# Patient Record
Sex: Male | Born: 1960 | Race: Black or African American | Hispanic: No | Marital: Married | Smoking: Never smoker
Health system: Southern US, Community
[De-identification: ages and names within clinical notes are randomized; demographics above are authoritative.]

## PROBLEM LIST (undated history)

## (undated) DIAGNOSIS — I10 Essential (primary) hypertension: Secondary | ICD-10-CM

## (undated) DIAGNOSIS — G4730 Sleep apnea, unspecified: Secondary | ICD-10-CM

## (undated) DIAGNOSIS — K219 Gastro-esophageal reflux disease without esophagitis: Secondary | ICD-10-CM

## (undated) DIAGNOSIS — Z87442 Personal history of urinary calculi: Secondary | ICD-10-CM

## (undated) DIAGNOSIS — M1990 Unspecified osteoarthritis, unspecified site: Secondary | ICD-10-CM

## (undated) DIAGNOSIS — E785 Hyperlipidemia, unspecified: Secondary | ICD-10-CM

## (undated) DIAGNOSIS — F32A Depression, unspecified: Secondary | ICD-10-CM

## (undated) DIAGNOSIS — F329 Major depressive disorder, single episode, unspecified: Secondary | ICD-10-CM

## (undated) DIAGNOSIS — E039 Hypothyroidism, unspecified: Secondary | ICD-10-CM

## (undated) DIAGNOSIS — M4800 Spinal stenosis, site unspecified: Secondary | ICD-10-CM

## (undated) HISTORY — PX: THYROID SURGERY: SHX805

---

## 1997-01-01 HISTORY — PX: KNEE ARTHROSCOPY: SUR90

## 2007-07-08 ENCOUNTER — Ambulatory Visit: Payer: Self-pay | Admitting: Internal Medicine

## 2008-03-16 ENCOUNTER — Ambulatory Visit: Payer: Self-pay | Admitting: Internal Medicine

## 2010-11-29 ENCOUNTER — Ambulatory Visit: Payer: Self-pay

## 2010-12-08 ENCOUNTER — Ambulatory Visit: Payer: Self-pay

## 2011-05-03 ENCOUNTER — Inpatient Hospital Stay: Payer: Self-pay | Admitting: Psychiatry

## 2011-05-03 LAB — CBC
HCT: 44.8 % (ref 40.0–52.0)
MCH: 31.1 pg (ref 26.0–34.0)
MCHC: 34.3 g/dL (ref 32.0–36.0)
Platelet: 333 10*3/uL (ref 150–440)
RDW: 13.4 % (ref 11.5–14.5)
WBC: 6.2 10*3/uL (ref 3.8–10.6)

## 2011-05-03 LAB — DRUG SCREEN, URINE
Amphetamines, Ur Screen: NEGATIVE (ref ?–1000)
Benzodiazepine, Ur Scrn: NEGATIVE (ref ?–200)
Cannabinoid 50 Ng, Ur ~~LOC~~: NEGATIVE (ref ?–50)
Cocaine Metabolite,Ur ~~LOC~~: NEGATIVE (ref ?–300)
Phencyclidine (PCP) Ur S: NEGATIVE (ref ?–25)
Tricyclic, Ur Screen: NEGATIVE (ref ?–1000)

## 2011-05-03 LAB — COMPREHENSIVE METABOLIC PANEL
Alkaline Phosphatase: 62 U/L (ref 50–136)
BUN: 12 mg/dL (ref 7–18)
Calcium, Total: 9 mg/dL (ref 8.5–10.1)
Chloride: 105 mmol/L (ref 98–107)
Co2: 31 mmol/L (ref 21–32)
EGFR (African American): >60
EGFR (Non-African Amer.): >60
Glucose: 99 mg/dL (ref 65–99)
Osmolality: 281 (ref 275–301)
Potassium: 3.8 mmol/L (ref 3.5–5.1)
SGOT(AST): 20 U/L (ref 15–37)
SGPT (ALT): 30 U/L
Sodium: 141 mmol/L (ref 136–145)
Total Protein: 7.6 g/dL (ref 6.4–8.2)

## 2011-05-03 LAB — ACETAMINOPHEN LEVEL: Acetaminophen: <2 ug/mL

## 2011-05-03 LAB — ETHANOL: Ethanol: <3 mg/dL

## 2011-11-09 ENCOUNTER — Ambulatory Visit: Payer: Self-pay | Admitting: Otolaryngology

## 2012-06-12 ENCOUNTER — Ambulatory Visit: Payer: Self-pay

## 2012-11-01 HISTORY — PX: THYROIDECTOMY: SHX17

## 2013-05-13 DIAGNOSIS — E039 Hypothyroidism, unspecified: Secondary | ICD-10-CM | POA: Insufficient documentation

## 2014-02-12 DIAGNOSIS — K219 Gastro-esophageal reflux disease without esophagitis: Secondary | ICD-10-CM | POA: Insufficient documentation

## 2014-02-12 DIAGNOSIS — I10 Essential (primary) hypertension: Secondary | ICD-10-CM | POA: Insufficient documentation

## 2014-02-12 DIAGNOSIS — E782 Mixed hyperlipidemia: Secondary | ICD-10-CM | POA: Insufficient documentation

## 2014-02-25 ENCOUNTER — Inpatient Hospital Stay: Payer: Self-pay | Admitting: Psychiatry

## 2014-04-22 DIAGNOSIS — M545 Low back pain, unspecified: Secondary | ICD-10-CM | POA: Insufficient documentation

## 2014-04-22 DIAGNOSIS — M1712 Unilateral primary osteoarthritis, left knee: Secondary | ICD-10-CM | POA: Insufficient documentation

## 2014-04-25 NOTE — Discharge Summary (Signed)
PATIENT NAME:  Louis Thomas, Louis Thomas MR#:  [id number] DATE OF BIRTH:  [birth year]  DATE OF ADMISSION:  05/03/2011 DATE OF DISCHARGE:  05/07/2011  HOSPITAL COURSE: See dictated history and physical for details of admission. This 54 year old man was admitted to the hospital with complaints of severe major depression that had been going on for quite a while. Things had been getting worse. He was having suicidal thoughts. It was getting harder for him to function. He was treated in the hospital with a combination of supportive and educational therapy, both individual and group, as well as initiating Prozac 20 mg per day. During his hospital stay the patient showed gradual improvement in his acute depression. Mood became a little bit brighter. Hopelessness decreased. Suicidal ideation went away. He was able to articulate more positive thoughts about himself and his life and more hopefulness about things in the future. He was able to articulate good reasons for not killing himself including the importance of his family. He showed more optimism. The patient agreed to follow-up with psychiatrist in the community.   DISCHARGE MEDICATIONS:  1. Synthroid 0.1 mg q.6 a.m.  2. Lisinopril/hydrochlorothiazide 10/12.5 one tablet per day.  3. Prozac 20 mg per day.  4. Ambien 10 mg at night p.r.n. for sleep.   LABORATORY RESULTS: Admission labs showed a drug screen that was negative. TSH was 0.48. Alcohol undetectable. Chemistries unremarkable. CBC normal. Acetaminophen and salicylates unremarkable.   DISCHARGE MENTAL STATUS EXAMINATION: Neatly dressed and groomed. Improved eye contact. Speech quiet but normal tone and rate. Affect still blunted but less so than before and no longer tearful. Mood stated as better. Thoughts lucid and directed. No evidence of loosening of associations or delusional thinking. Denies suicidal or homicidal ideation. Denies hallucinations. Improved insight and judgment.   DISPOSITION: Discharge back  home. Follow-up with psychiatrist in the community.   DIAGNOSES PRINCIPLE AND PRIMARY:  AXIS I: Major depressive episode, severe, single episode.   SECONDARY DIAGNOSES:  AXIS I: No further.   AXIS II: No diagnosis.   AXIS III:  1. Hypertension. 2. Hypothyroidism.   AXIS IV: Moderate stress.   AXIS V: Functioning at time of discharge 54.  ____________________________ Gonzella Lex, MD jtc:drc D: 05/10/2011 17:17:12 ET T: 05/11/2011 10:49:36 ET JOB#: [id number]  cc: Gonzella Lex, MD, <Dictator> Gonzella Lex MD ELECTRONICALLY SIGNED 05/11/2011 11:39

## 2014-04-25 NOTE — H&P (Signed)
PATIENT NAME:  Louis Thomas, Louis Thomas MR#:  [id number] DATE OF BIRTH:  [birth year]  DATE OF ADMISSION:  05/03/2011  IDENTIFYING INFORMATION AND CHIEF COMPLAINT: 54 year old man presented voluntarily to the Emergency Room.   CHIEF COMPLAINT: "My wife told me I should come here."   HISTORY OF PRESENT ILLNESS: Patient reports symptoms of severe major depression that have been present for years and have been getting worse. Mood stays down and depressed and negative most of the time. Almost constant negative thinking about himself. Feels worthless, hopeless, and like he would be better off dead. Has had some thoughts about killing himself, but without any plan that he will admit to. He has become more withdrawn socially and does not feel comfortable leaving the house. Has had what sounds like possibly some paranoid ideation, feeling like people are watching him and talking about him. He has felt more stressed at work. Today he was unable to "cope" and did not go in to work. He is not clear that there has been any particular specific event that happened that triggered this but apparently his wife has finally prevailed upon him to come to the Emergency Room. Patient has not been on any kind of treatment for depression for some years. He does not drink or abuse drugs. Major stresses are not entirely clear. He says that his work is stressful but it does not sound like it is any different than it has been for 20 some years. He does not describe any specific problem at home. Health seems to be reasonably good for someone his age.   PAST PSYCHIATRIC HISTORY: No previous psychiatric hospitalizations. Denies history of suicide attempts. He used to see a therapist and psychiatrist in the past. Says this was several years ago. He remembers that the therapist that he saw was Derrek Gu but cannot remember a doctor that he saw. He cannot remember what medications he took but says that he took several different antidepressants. He  says that he thought that they all "cause more trouble than they helped". It is not clear that he was on anything for a therapeutic trial and he cannot remember the names of any medicines.   SUBSTANCE ABUSE HISTORY: Says that occasionally he will drink a small glass of wine at night to try and help himself sleep but has cut back on that recently. No history of alcohol abuse. No history of drug abuse.   PAST MEDICAL HISTORY: Says that he has high blood pressure but cannot remember the name of the medicine that he takes. Also has hypothyroidism, had resection of the thyroid lobes several years ago. He has been on Synthroid ever since. He cannot remember the dose. His primary care doctor is Dr. Freda Munro.   FAMILY HISTORY: One of his brothers committed suicide last year. The brother was suffering from Parkinson's disease. No other clear family history of mental illness.   SOCIAL HISTORY: Patient lives with his wife. He has two grown children. 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 He says that it is stressful but it is not clear that there has been any cataclysmic stress with it.   REVIEW OF SYSTEMS: Mood is depressed, feels negative. Has negative thoughts about himself all the time. Feels anxious a lot of the time. Feels like people are talking about him. Stays up late at night because he feels like his mind is racing. Appetite is normal. Feels fatigued much of the time during  the day. Has had some passive suicidal thoughts. No admitted acute plan. Denies hallucinations.   MENTAL STATUS EXAMINATION: This is a patient interviewed in the Emergency Room wearing hospital clothes. Eye contact was remarkably poor. He did not make any eye contact with me in the 25 minutes or more that we were talking. His psychomotor activity was limited. Speech was quiet but easy to understand. Affect was blunted, flat and became tearful later in the interview. Mood was stated as  depressed. Thoughts are slow but lucid. No obvious loosening of associations or delusions, although it is hard to tell with the paranoia. Denies hallucinations. Has had some recent suicidal thoughts. Denies immediate intent but is not very convincing because he keeps talking about how worthless he feels. Denies homicidal ideation. Insight and judgment somewhat impaired.   PHYSICAL EXAMINATION:  VITAL SIGNS: Pulse 79, temperature 98.3, respirations 18, blood pressure 113/69.   HEENT: Pupils are equal and reactive. Face is symmetric. Mucosa normal. He wears glasses.   MUSCULOSKELETAL: Full range of motion at all extremities. Normal gait.   LUNGS: Clear with no extra sounds.   HEART: Regular rate and rhythm.   ABDOMEN: Soft, nontender, normal bowel sounds.   NEUROLOGICAL: Strength and reflexes symmetric and normal throughout.   SKIN: No skin lesions identified.   CURRENT MEDICATIONS:  1. Synthroid, unknown dose. 2. Unknown blood pressure medicine, unknown dose.   ALLERGIES: Sulfa.   ASSESSMENT: This is a 54 year old man who is presenting with multiple symptoms of severe major depression including suicidal ideation. He downplays it a little bit but it sounds like it has really been pretty pronounced recently. Significant worries here about the patient having a risk of suicide. Not currently getting any kind of treatment for his depression. Certainly does seem to need hospitalization for stability and safety.   TREATMENT PLAN: Admit patient to the hospital. I propose to him that since he cannot remember any of the medicines he has been on before we will simply start with fluoxetine 20 mg a day. He did not argue about that. Continue Synthroid. At the moment I am going to arbitrarily pick a medium size dose of 100 mcg a day. We will follow up with that and see what we can find out. Since his blood pressure is not elevated right now I will defer starting any blood pressure medicine. Engage him in  groups and activities on the unit, get collateral information, review lab studies.   DIAGNOSIS PRINCIPLE AND PRIMARY:  AXIS I: Major depressive episode, severe, recurrent.   SECONDARY DIAGNOSES:  AXIS I: No further.   AXIS II: No diagnosis.   AXIS III:  1. Hypertension.  2. Hypothyroidism.   AXIS IV: Mild to moderate-Seems to have supportive family situation.   AXIS V: Functioning at time of admission 24.   ____________________________ Gonzella Lex, MD jtc:cms D: 05/03/2011 19:32:07 ET T: 05/04/2011 06:10:37 ET JOB#: [id number]  cc: Gonzella Lex, MD, <Dictator> Gonzella Lex MD ELECTRONICALLY SIGNED 05/04/2011 8:54

## 2014-05-02 NOTE — H&P (Signed)
PATIENT NAME:  Louis Thomas, Louis Thomas MR#:  [id number] DATE OF BIRTH:  08-14-1960  DATE OF ADMISSION:  02/25/2014  REFERRING PHYSICIAN: Emergency Room MD   ATTENDING PHYSICIAN: Jolanta B. Pucilowska, MD   IDENTIFYING DATA: Mr. Louis Thomas is a 53 year old male with history of anxiety and depression.   CHIEF COMPLAINT: "I'm mad."  HISTORY OF PRESENT ILLNESS: Mr. Louis Thomas has a history of depression. He was hospitalized at [facility] 2 years ago in a similar scenario. He initially followed up with psychiatrist and a therapist, but after he got better he stopped those treatments. He reports that for the past several months it has been increasingly difficult for him to do his work. 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 They fired several people now required a smaller number of machinists, increases productivity, and the patient finds it is very difficult; he is a Secondary school teacher. He feels that management does not allow him adequate time to do his work well. In addition, he is asked to do additional tasks while working on his machine. This causes disruption in his workflow and imperfection in the final product. He refuses to do inadequate products. There is also conflict with his supervisor, who  the patient fears is pestering him, following him, making constant derogatory comments. He has been increasingly depressed and anxious. On the day of admission, when his supervisor was, again, following him around making comments, the patient and asked him to leave him alone, as he was unable to get away from pestering. This was considered inappropriate behavior. The patient became increasingly angry. He fears that he could hurt himself or other people at work. He came to the hospital asking for help. He endorses many symptoms of depression with poor sleep, decreased appetite, anhedonia, feeling hopelessness, worthlessness, poor energy and concentration, social  isolation, crying spells, heightened anxiety that now culminated in suicidal thoughts. He denies psychotic symptoms. Denies symptoms suggestive of bipolar mania. He denies alcohol or illicit substance use.   PAST PSYCHIATRIC HISTORY: As above. One prior hospitalization 2 years ago. He was prescribed Prozac at bedtime for depression and Ambien for sleep, which he tolerated well. There were no suicide attempts. No history of substance use.   FAMILY PSYCHIATRIC HISTORY: He is brother committed suicide 3 years ago. He had severe Parkinson's; the patient does not know any details of his mental illness.   PAST MEDICAL HISTORY: Hypertension, constipation, hypothyroidism.   ALLERGIES: SULFA DRUGS.   MEDICATIONS ON ADMISSION: Amlodipine 5 mg daily, Lexapro 10 mg daily, hydrochlorothiazide 25 mg daily, Synthroid 0.125 mg daily, Colace 100 mg twice daily,   SOCIAL HISTORY: He is married. He lives with his wife, who is very supportive. 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 He used to be very proud of his work and of his team. He feels that everything has been ruined with the new management and is unable to continue working. His wife discourages him from going back to work, as she knows how depressed and sad the patient has become.   REVIEW OF SYSTEMS:  CONSTITUTIONAL: No fevers or chills. No weight changes.  EYES: No double or blurred vision.  EAR, NOSE, AND THROAT: No hearing loss.  RESPIRATORY: No shortness of breath or cough.  CARDIOVASCULAR: No chest pain or orthopnea.  GASTROINTESTINAL: No abdominal pain, nausea, vomiting, or diarrhea.  GENITOURINARY: No incontinence or frequency.  ENDOCRINE: No heat or cold  intolerance.  LYMPHATIC: No anemia or easy bruising.  INTEGUMENTARY: No acne or rash.  MUSCULOSKELETAL: No muscle or joint pain.  NEUROLOGIC: No tingling or weakness.  PSYCHIATRIC: See history of present illness for details.   PHYSICAL EXAMINATION:   VITAL SIGNS: Blood pressure 156/108, pulse 74, respirations 18, temperature 98.8.  GENERAL: This is a well-developed, middle-aged male in no acute distress.  HEENT: The pupils are equal, round, and reactive to light.  NECK: Supple. No thyromegaly.  LUNGS: Clear to auscultation.  HEART: Regular rhythm and rate. No rubs, murmurs, or gallops.  ABDOMEN: Soft, nondistended. Positive bowel sounds.  MUSCULOSKELETAL: Normal muscle strength in all extremities.  SKIN: No rashes or bruises.  LYMPHATIC: No cervical adenopathy.  NEUROLOGIC: Cranial nerves II-XII are intact.   LABORATORY DATA: Chemistries are within normal limits except for blood glucose of 134 and sodium of 135. Blood alcohol level is 0. LFTs are within normal limits. Urine toxicology screen is negative for substances. TSH 16.7, free thyroxine 0.95. CBC within normal limits. Serum acetaminophen and salicylates are low.   MENTAL STATUS EXAMINATION ON ADMISSION: The patient is alert and oriented to person, place, time, and situation. He is pleasant, polite, and cooperative. There is severe psychomotor retardation. He maintains limited eye contact. He is well-groomed and casually dressed. His speech is very soft. Mood is depressed with flat affect. Thought process is logical and goal oriented. Thought content: He is no longer suicidal or homicidal, but was admitted after voicing thoughts of hurting himself and his coworkers at work. He is not delusional or paranoid. He denies auditory or visual hallucinations. His cognition is grossly intact. Registration, recall, short and Berlanga-term memory are intact. He is of average intelligence and fund of knowledge. His insight and judgment are fair.   SUICIDE RISK ASSESSMENT ON ADMISSION: This is a patient with a history of depression and anxiety who returns to the hospital because there is depressed and suicidal in the context of stress at work.    INITIAL DIAGNOSES:  AXIS I: Major depressive disorder,  recurrent, severe.  AXIS II: Deferred.  AXIS III: Hypertension, hypothyroidism, constipation.   PLAN: The patient was admitted to [facility] behavioral medicine unit for safety, stabilization, and medication management.  1.  Suicidal ideation: The patient is able to contract for safety.  2.  Mood: The patient complained of abdominal pain, nausea, and diarrhea from Lexapro and is asking to switch back to Prozac. We will prescribe 20 mg of Prozac.  3.  Insomnia: He will be offered Ambien; that was helpful in the past.  4.  Hypertension: We will continue lisinopril and hydrochlorothiazide.  5.  Hypothyroidism: His TSH is elevated with normal T4. We will continue Synthroid.   DISPOSITION: He will be discharged to home with his wife. He will follow up with a new psychiatrist in the community, probably CBC, given his insurance.     ____________________________ Wardell Honour. Bary Leriche, MD jbp:bm D: 02/25/2014 22:28:18 ET T: 02/25/2014 22:52:28 ET JOB#: [id number]  cc: Jolanta B. Bary Leriche, MD, <Dictator> Clovis Fredrickson MD ELECTRONICALLY SIGNED 03/15/2014 7:23

## 2014-05-02 NOTE — Consult Note (Signed)
PATIENT NAME:  Louis Thomas, Louis Thomas MR#:  [id number] DATE OF BIRTH:  07-Sep-1960  DATE OF CONSULTATION:  02/24/2014  CONSULTING PHYSICIAN:  Gonzella Lex, MD  IDENTIFYING INFORMATION AND REASON FOR CONSULTATION: A 54 year old man with a past history of depression who presented to the Emergency Room stating that he just felt overwhelmed.   HISTORY OF PRESENT ILLNESS: Information obtained from the patient and the chart. The patient reports that for the past several weeks, he has been feeling more and more overwhelmed at work. He talks about how he feels like the people at work are harassing him by demanding too much from him. He says he feels like he has a target on his back at work. It sounds like he does have a lot of demands placed on him but it sounds like he is taking it in the worst way and is getting paranoid. He feels tired all the time. He feels hopeless. He has had some suicidal thoughts and admits that he has even had thoughts about losing his temper and going off and assaulting or killing people at work although does not have any plan to do anything like that. Not having any psychotic symptoms. He is not currently getting any outpatient psychiatric treatment and denies abuse of alcohol or drugs. He has not been going for a mental health treatment for a couple of years. Job is a major stress to him. He had tried to go see his doctor a week ago and wanted to get a referral to a psychiatric provider, but says he has not gotten a referral yet instead his primary care doctor called in what he called some kind of medicine which appears to be Lexapro. He says he only took it a couple of times, but he thought it made him feel tired.   PAST PSYCHIATRIC HISTORY: He was admitted here in 2013 and at that time he was also suffering severe depression. Similar kind of symptoms of anxiety and paranoia and frustration along with it, but did not appear to be fully psychotic. Prior to that he had had treatment for  depression in the past as well. He denies hospitalizations other than the one here in 2013, denies any serious suicide attempts or history of violence. He thinks that the Prozac that he took helped him.   SOCIAL HISTORY:  He lives with his wife. He works as a Glass blower/designer. He finds it very stressful.  He says he has been there 32 years and their demanding too much from him.   FAMILY HISTORY: None known   PAST MEDICAL HISTORY: High blood pressure and hypothyroidism.   SUBSTANCE ABUSE: He says he does not drink much. He has not been drinking a significant amount recently and denies that he abuses any other drugs.   CURRENT MEDICATIONS: Hydrochlorothiazide 25 mg a day, levothyroxine 100 mcg a day, amlodipine 5 mg a day.   ALLERGIES: Sulfa.   REVIEW OF SYSTEMS:  Depressed mood. Fatigue. Feeling overwhelmed. Denies auditory or visual hallucinations. Passive suicidal and homicidal ideation, but felt overwhelmed by them yesterday.   MENTAL STATUS EXAMINATION:  Neatly groomed gentleman who looks his stated age, cooperative with the interview. Eye contact intermittent, psychomotor activity, fidgety. Speech is decreased in tone, sometimes are rushed. Thoughts are scattered but not psychotic at all. No evidence of delusional thinking, although he has a paranoid cast to his thinking. He denies auditory or visual hallucinations.  He denies acute suicidal or homicidal intent, but does seem to feel  at times, overwhelmed and out of control. He can repeat three words immediately, only remembers one of them at three minutes. He is alert and oriented x4. Judgment and insight slightly questionable, intelligence normal.    PHYSICAL EXAMINATION:   VITAL SIGNS: Blood pressure is 154/108, respirations 20, pulse 114, and temperature 99.   LABORATORY RESULTS: Drug screen is all negative. Free thyroxine normal at 0.95. TSH elevated at 75.8 and salicylates negative. CBC normal. Alcohol negative. Chemistry panel: Low  potassium 3.2, low sodium 135.   ASSESSMENT: A 54 year old man with recurrent episodes of major depression, not getting outpatient treatment, once again feeling overwhelmed, having some paranoid ideas that suggest the possibility of exploding and hurting himself or someone else without treatment and feeling rather helpless. I think he needs hospitalization for stabilization.   TREATMENT PLAN: Admit to psychiatry. Suicide precautions in place. Continue his outpatient medicine, including the Lexapro that was recently started. The patient will be engaged in groups and activities and the primary treatment team downstairs can work on further plans.   DIAGNOSIS, PRINCIPAL AND PRIMARY:  AXIS I: Major depression, severe, recurrent.   SECONDARY DIAGNOSES:  AXIS I: No further.  AXIS II: No diagnosis.  AXIS III: High blood pressure, hypothyroidism.    ____________________________ Gonzella Lex, MD jtc:at D: 02/24/2014 18:23:13 ET T: 02/24/2014 18:48:48 ET JOB#: [id number]  cc: Gonzella Lex, MD, <Dictator> Gonzella Lex MD ELECTRONICALLY SIGNED 03/09/2014 10:36

## 2014-06-20 ENCOUNTER — Encounter (HOSPITAL_COMMUNITY): Payer: Self-pay | Admitting: Emergency Medicine

## 2014-06-20 ENCOUNTER — Emergency Department (HOSPITAL_COMMUNITY)
Admission: EM | Admit: 2014-06-20 | Discharge: 2014-06-20 | Disposition: A | Discharge status: Home / Self Care | Payer: 59 | Attending: Emergency Medicine | Admitting: Emergency Medicine

## 2014-06-20 DIAGNOSIS — E669 Obesity, unspecified: Secondary | ICD-10-CM | POA: Diagnosis not present

## 2014-06-20 DIAGNOSIS — R42 Dizziness and giddiness: Secondary | ICD-10-CM | POA: Insufficient documentation

## 2014-06-20 DIAGNOSIS — Z79899 Other long term (current) drug therapy: Secondary | ICD-10-CM | POA: Diagnosis not present

## 2014-06-20 DIAGNOSIS — E039 Hypothyroidism, unspecified: Secondary | ICD-10-CM | POA: Insufficient documentation

## 2014-06-20 DIAGNOSIS — I10 Essential (primary) hypertension: Secondary | ICD-10-CM | POA: Diagnosis not present

## 2014-06-20 DIAGNOSIS — R55 Syncope and collapse: Secondary | ICD-10-CM | POA: Insufficient documentation

## 2014-06-20 HISTORY — DX: Essential (primary) hypertension: I10

## 2014-06-20 HISTORY — DX: Hypothyroidism, unspecified: E03.9

## 2014-06-20 LAB — I-STAT CHEM 8, ED
BUN: 17 mg/dL (ref 6–20)
CALCIUM ION: 1.15 mmol/L (ref 1.12–1.23)
Chloride: 98 mmol/L — ABNORMAL LOW (ref 101–111)
Creatinine, Ser: 1.4 mg/dL — ABNORMAL HIGH (ref 0.61–1.24)
GLUCOSE: 149 mg/dL — AB (ref 65–99)
HCT: 45 % (ref 39.0–52.0)
HEMOGLOBIN: 15.3 g/dL (ref 13.0–17.0)
POTASSIUM: 3.5 mmol/L (ref 3.5–5.1)
Sodium: 141 mmol/L (ref 135–145)
TCO2: 27 mmol/L (ref 0–100)

## 2014-06-20 LAB — CBC
HEMATOCRIT: 43.4 % (ref 39.0–52.0)
Hemoglobin: 14.4 g/dL (ref 13.0–17.0)
MCH: 30.6 pg (ref 26.0–34.0)
MCHC: 33.2 g/dL (ref 30.0–36.0)
MCV: 92.1 fL (ref 78.0–100.0)
PLATELETS: 280 10*3/uL (ref 150–400)
RBC: 4.71 MIL/uL (ref 4.22–5.81)
RDW: 13.5 % (ref 11.5–15.5)
WBC: 7.5 10*3/uL (ref 4.0–10.5)

## 2014-06-20 LAB — CBG MONITORING, ED: GLUCOSE-CAPILLARY: 136 mg/dL — AB (ref 65–99)

## 2014-06-20 NOTE — ED Notes (Signed)
[employer]ge [employer] manager, [employer] in to speak with pt that is upset over wait to see MD.  States he wants to leave.  Pt raising voice and states he doesn't want to tell me about why he is here.  EDP in to eval pt while RN speaking with pt.  Pt initially becoming verbally aggressive with EDP and states he doesn't want to tell anyone else what happened.  Family members at bedside explaining to Dr. Alvino Chapel what happened with pt today.

## 2014-06-20 NOTE — ED Notes (Signed)
Pt ambulated on the hallway with steady gait, no dizziness or SOB noticed.

## 2014-06-20 NOTE — Discharge Instructions (Signed)
Near-Syncope Near-syncope (commonly known as near fainting) is sudden weakness, dizziness, or feeling like you might pass out. During an episode of near-syncope, you may also develop pale skin, have tunnel vision, or feel sick to your stomach (nauseous). Near-syncope may occur when getting up after sitting or while standing for a [place]on time. It is caused by a sudden decrease in blood flow to the brain. This decrease can result from various causes or triggers, most of which are not serious. However, because near-syncope can sometimes be a sign of something serious, a medical evaluation is required. The specific cause is often not determined. HOME CARE INSTRUCTIONS  Monitor your condition for any changes. The following actions may help to alleviate any discomfort you are experiencing:  Have someone stay with you until you feel stable.  Lie down right away and prop your feet up if you start feeling like you might faint. Breathe deeply and steadily. Wait until all the symptoms have passed. Most of these episodes last only a few minutes. You may feel tired for several hours.   Drink enough fluids to keep your urine clear or pale yellow.   If you are taking blood pressure or heart medicine, get up slowly when seated or lying down. Take several minutes to sit and then stand. This can reduce dizziness.  Follow up with your health care provider as directed. SEEK IMMEDIATE MEDICAL CARE IF:   You have a severe headache.   You have unusual pain in the chest, abdomen, or back.   You are bleeding from the mouth or rectum, or you have black or tarry stool.   You have an irregular or very fast heartbeat.   You have repeated fainting or have seizure-like jerking during an episode.   You faint when sitting or lying down.   You have confusion.   You have difficulty walking.   You have severe weakness.   You have vision problems.  MAKE SURE YOU:   Understand these instructions.  Will  watch your condition.  Will get help right away if you are not doing well or get worse. Document Released: 12/18/2004 Document Revised: 12/23/2012 Document Reviewed: 05/23/2012 Exit[employer] Patient Information 2015 [employer]. This information is not intended to replace advice given to you by your health care provider. Make sure you discuss any questions you have with your health care provider.  

## 2014-06-20 NOTE — ED Notes (Signed)
Pt brought to ED for near syncope episode at home and nauseas, pt denies any fever, chills or diarrhea. On EMS arrival BP 80/56.

## 2014-06-20 NOTE — ED Notes (Signed)
Technical difficulties with EKG. Switched wires and obtained EKG. EKG handed to Dr. Alvino Chapel

## 2014-06-20 NOTE — ED Notes (Signed)
Pt very upset at this time that MD is not been on his room yet, states he wants to leave ED without been seen that he feels ok, pt denies any pain. Pt oriented that due to several traumas and emergencies is taking a litter longer to see him, but MD will be there ASAP, pt got very upset and stated that we just no care about him and only care about running his insurance, CN notified to try to talk to the pt at this point.

## 2014-06-20 NOTE — ED Provider Notes (Signed)
CSN: [id number]     Arrival date & time 06/20/14  2000 History   First MD Initiated Contact with Patient 06/20/14 2103     Chief Complaint  Patient presents with  . Near Syncope     (Consider location/radiation/quality/duration/timing/severity/associated sxs/prior Treatment) Patient is a 54 y.o. male presenting with near-syncope. The history is provided by the patient.  Near Syncope This is a new problem. Pertinent negatives include no chest pain, no abdominal pain, no headaches and no shortness of breath.   patient presents with a near syncopal episode. He was eating his father stated dinner and went hypotensive and felt like he was in a pass out. He states he took medicines that he usually takes at night and make him sleepy. He states he took earlier and he thinks this is what caused it. He was somewhat unhappy because he had to wait to be seen. No chest pain. No trouble breathing. He's been doing well otherwise. No headache. No confusion. He is feeling much better now. No dysuria. Does have a history of hypothyroidism and that has been followed by his doctor.  Past Medical History  Diagnosis Date  . Hypertension   . Hypothyroid    Past Surgical History  Procedure Laterality Date  . Orthopedic surgery    . Thyroidectomy     No family history on file. History  Substance Use Topics  . Smoking status: Never Smoker   . Smokeless tobacco: Not on file  . Alcohol Use: No    Review of Systems  Constitutional: Negative for activity change and appetite change.  Eyes: Negative for pain.  Respiratory: Negative for chest tightness and shortness of breath.   Cardiovascular: Positive for near-syncope. Negative for chest pain and leg swelling.  Gastrointestinal: Negative for nausea, vomiting, abdominal pain and diarrhea.  Genitourinary: Negative for flank pain.  Musculoskeletal: Negative for back pain and neck stiffness.  Skin: Negative for rash.  Neurological: Positive for  light-headedness. Negative for weakness, numbness and headaches.  Psychiatric/Behavioral: Negative for behavioral problems.      Allergies  Sulfa antibiotics  Home Medications   Prior to Admission medications   Medication Sig Start Date End Date Taking? Authorizing Provider  amLODipine (NORVASC) 10 MG tablet Take 10 mg by mouth daily. 03/18/14 03/18/15 Yes Historical Provider, MD  FLUoxetine (PROZAC) 40 MG capsule Take 40 mg by mouth daily. 06/18/14  Yes Historical Provider, MD  hydrochlorothiazide (HYDRODIURIL) 25 MG tablet Take 25 mg by mouth daily. 03/18/14  Yes Historical Provider, MD  levothyroxine (SYNTHROID, LEVOTHROID) 137 MCG tablet Take 137 mcg by mouth daily. 05/05/14 05/05/15 Yes Historical Provider, MD  meloxicam (MOBIC) 15 MG tablet Take 15 mg by mouth as needed. For pain 03/18/14 03/18/15 Yes Historical Provider, MD  omeprazole (PRILOSEC) 40 MG capsule Take 40 mg by mouth as needed. For heartburn 03/18/14  Yes Historical Provider, MD  QUEtiapine (SEROQUEL) 100 MG tablet Take 100 mg by mouth at bedtime. 05/12/14  Yes Historical Provider, MD  tiZANidine (ZANAFLEX) 4 MG tablet Take 4 mg by mouth daily as needed. For muscle spasms 03/18/14  Yes Historical Provider, MD  ARIPiprazole (ABILIFY) 2 MG tablet Take 2 mg by mouth daily. 06/18/14   Historical Provider, MD   BP 113/67 mmHg  Pulse 98  Temp(Src) 98.3 F (36.8 C) (Oral)  Resp 24  Ht 5\' 10"  (1.778 m)  Wt 250 lb (113.399 kg)  BMI 35.87 kg/m2  SpO2 95% Physical Exam  Constitutional: He is oriented to person, place, and  time. He appears well-developed and well-nourished.  Patient is obese  HENT:  Head: Normocephalic and atraumatic.  Neck: Normal range of motion. Neck supple.  Cardiovascular: Normal rate, regular rhythm and normal heart sounds.   No murmur heard. Pulmonary/Chest: Effort normal and breath sounds normal.  Abdominal: Soft. Bowel sounds are normal. He exhibits no distension. There is no tenderness.  Musculoskeletal:  Normal range of motion.  Neurological: He is alert and oriented to person, place, and time.  Skin: Skin is warm and dry.  Psychiatric: He has a normal mood and affect.  Nursing note and vitals reviewed.   ED Course  Procedures (including critical care time) Labs Review Labs Reviewed  CBG MONITORING, ED - Abnormal; Notable for the following:    Glucose-Capillary 136 (*)    All other components within normal limits  I-STAT CHEM 8, ED - Abnormal; Notable for the following:    Chloride 98 (*)    Creatinine, Ser 1.40 (*)    Glucose, Bld 149 (*)    All other components within normal limits  CBC    Imaging Review No results found.   EKG Interpretation   Date/Time:  Sunday June 20 2014 20:07:36 EDT Ventricular Rate:  75 PR Interval:  181 QRS Duration: 82 QT Interval:  383 QTC Calculation: 428 R Axis:   66 Text Interpretation:  Sinus rhythm Consider left atrial enlargement  Confirmed by Alvino Chapel  MD, Ovid Curd [phone]) on 06/20/2014 10:18:41 PM      MDM   Final diagnoses:  Near syncope    Patient with near-syncope. May be due to his medications. No longer hypotensive and feels better. Labs reassuring. Will discharge home.    Davonna Belling, MD 06/20/14 [id number]

## 2014-08-27 ENCOUNTER — Other Ambulatory Visit: Payer: Self-pay | Admitting: Internal Medicine

## 2014-08-27 ENCOUNTER — Other Ambulatory Visit: Payer: Self-pay

## 2014-08-27 DIAGNOSIS — M5410 Radiculopathy, site unspecified: Secondary | ICD-10-CM

## 2014-09-03 ENCOUNTER — Ambulatory Visit
Admission: RE | Admit: 2014-09-03 | Discharge: 2014-09-03 | Disposition: A | Discharge status: Home / Self Care | Payer: 59 | Source: Ambulatory Visit | Attending: Internal Medicine | Admitting: Internal Medicine

## 2014-09-03 DIAGNOSIS — M4806 Spinal stenosis, lumbar region: Secondary | ICD-10-CM | POA: Diagnosis not present

## 2014-09-03 DIAGNOSIS — M5410 Radiculopathy, site unspecified: Secondary | ICD-10-CM | POA: Insufficient documentation

## 2014-09-03 DIAGNOSIS — X58XXXA Exposure to other specified factors, initial encounter: Secondary | ICD-10-CM | POA: Insufficient documentation

## 2014-09-03 DIAGNOSIS — M4856XA Collapsed vertebra, not elsewhere classified, lumbar region, initial encounter for fracture: Secondary | ICD-10-CM | POA: Diagnosis not present

## 2014-09-03 DIAGNOSIS — M5127 Other intervertebral disc displacement, lumbosacral region: Secondary | ICD-10-CM | POA: Diagnosis not present

## 2014-11-26 DIAGNOSIS — E559 Vitamin D deficiency, unspecified: Secondary | ICD-10-CM | POA: Insufficient documentation

## 2015-01-25 DIAGNOSIS — M19011 Primary osteoarthritis, right shoulder: Secondary | ICD-10-CM | POA: Insufficient documentation

## 2015-02-02 DIAGNOSIS — M5126 Other intervertebral disc displacement, lumbar region: Secondary | ICD-10-CM | POA: Insufficient documentation

## 2015-02-02 DIAGNOSIS — M48062 Spinal stenosis, lumbar region with neurogenic claudication: Secondary | ICD-10-CM | POA: Insufficient documentation

## 2015-05-02 ENCOUNTER — Encounter: Payer: Self-pay | Admitting: *Deleted

## 2015-05-09 NOTE — Discharge Instructions (Signed)

## 2015-05-10 ENCOUNTER — Ambulatory Visit: Payer: 59 | Admitting: Anesthesiology

## 2015-05-10 ENCOUNTER — Encounter
Admission: RE | Disposition: A | Discharge status: Home / Self Care | Payer: Self-pay | Source: Ambulatory Visit | Attending: Gastroenterology

## 2015-05-10 ENCOUNTER — Ambulatory Visit
Admission: RE | Admit: 2015-05-10 | Discharge: 2015-05-10 | Disposition: A | Discharge status: Home / Self Care | Payer: 59 | Source: Ambulatory Visit | Attending: Gastroenterology | Admitting: Gastroenterology

## 2015-05-10 DIAGNOSIS — Z8349 Family history of other endocrine, nutritional and metabolic diseases: Secondary | ICD-10-CM | POA: Insufficient documentation

## 2015-05-10 DIAGNOSIS — Z8249 Family history of ischemic heart disease and other diseases of the circulatory system: Secondary | ICD-10-CM | POA: Insufficient documentation

## 2015-05-10 DIAGNOSIS — Z9889 Other specified postprocedural states: Secondary | ICD-10-CM | POA: Diagnosis not present

## 2015-05-10 DIAGNOSIS — Z1211 Encounter for screening for malignant neoplasm of colon: Secondary | ICD-10-CM | POA: Diagnosis not present

## 2015-05-10 DIAGNOSIS — Z9852 Vasectomy status: Secondary | ICD-10-CM | POA: Insufficient documentation

## 2015-05-10 DIAGNOSIS — Z833 Family history of diabetes mellitus: Secondary | ICD-10-CM | POA: Insufficient documentation

## 2015-05-10 DIAGNOSIS — Z8261 Family history of arthritis: Secondary | ICD-10-CM | POA: Diagnosis not present

## 2015-05-10 DIAGNOSIS — Z79899 Other long term (current) drug therapy: Secondary | ICD-10-CM | POA: Insufficient documentation

## 2015-05-10 DIAGNOSIS — Z823 Family history of stroke: Secondary | ICD-10-CM | POA: Insufficient documentation

## 2015-05-10 DIAGNOSIS — K5730 Diverticulosis of large intestine without perforation or abscess without bleeding: Secondary | ICD-10-CM | POA: Insufficient documentation

## 2015-05-10 DIAGNOSIS — Z818 Family history of other mental and behavioral disorders: Secondary | ICD-10-CM | POA: Insufficient documentation

## 2015-05-10 DIAGNOSIS — Z791 Long term (current) use of non-steroidal anti-inflammatories (NSAID): Secondary | ICD-10-CM | POA: Insufficient documentation

## 2015-05-10 DIAGNOSIS — K219 Gastro-esophageal reflux disease without esophagitis: Secondary | ICD-10-CM | POA: Diagnosis not present

## 2015-05-10 HISTORY — DX: Major depressive disorder, single episode, unspecified: F32.9

## 2015-05-10 HISTORY — DX: Hyperlipidemia, unspecified: E78.5

## 2015-05-10 HISTORY — PX: COLONOSCOPY: SHX5424

## 2015-05-10 HISTORY — DX: Depression, unspecified: F32.A

## 2015-05-10 HISTORY — DX: Gastro-esophageal reflux disease without esophagitis: K21.9

## 2015-05-10 HISTORY — DX: Spinal stenosis, site unspecified: M48.00

## 2015-05-10 HISTORY — DX: Unspecified osteoarthritis, unspecified site: M19.90

## 2015-05-10 SURGERY — COLONOSCOPY
Anesthesia: Monitor Anesthesia Care | Wound class: Contaminated

## 2015-05-10 MED ORDER — ACETAMINOPHEN 325 MG PO TABS
325.0000 mg | ORAL_TABLET | ORAL | Status: DC | PRN
Start: 2015-05-10 — End: 2015-05-10

## 2015-05-10 MED ORDER — LACTATED RINGERS IV SOLN
INTRAVENOUS | Status: DC
  Administered 2015-05-10: 11:00:00 via INTRAVENOUS

## 2015-05-10 MED ORDER — PROPOFOL 10 MG/ML IV BOLUS
INTRAVENOUS | Status: DC | PRN
  Administered 2015-05-10: 100 mg via INTRAVENOUS
  Administered 2015-05-10 (×3): 50 mg via INTRAVENOUS

## 2015-05-10 MED ORDER — LIDOCAINE HCL (CARDIAC) 20 MG/ML IV SOLN
INTRAVENOUS | Status: DC | PRN
  Administered 2015-05-10: 30 mg via INTRAVENOUS

## 2015-05-10 MED ORDER — SODIUM CHLORIDE 0.9 % IV SOLN: INTRAVENOUS | Status: DC

## 2015-05-10 MED ORDER — ONDANSETRON HCL 4 MG/2ML IJ SOLN: 4.0000 mg | Freq: Once | INTRAMUSCULAR | Status: DC | PRN

## 2015-05-10 MED ORDER — STERILE WATER FOR IRRIGATION IR SOLN
Status: DC | PRN
  Administered 2015-05-10: 12:00:00

## 2015-05-10 MED ORDER — ACETAMINOPHEN 160 MG/5ML PO SOLN
325.0000 mg | ORAL | Status: DC | PRN
Start: 2015-05-10 — End: 2015-05-10

## 2015-05-10 SURGICAL SUPPLY — 30 items

## 2015-05-10 NOTE — Anesthesia Preprocedure Evaluation (Signed)
Anesthesia Evaluation  Patient identified by MRN, date of birth, ID band Patient awake    Reviewed: Allergy & Precautions, NPO status , Patient's Chart, lab work & pertinent test results  Airway Mallampati: II  TM Distance: >3 FB Neck ROM: Full    Dental no notable dental hx.    Pulmonary neg pulmonary ROS,    Pulmonary exam normal        Cardiovascular hypertension, Pt. on medications Normal cardiovascular exam     Neuro/Psych PSYCHIATRIC DISORDERS Depression negative neurological ROS     GI/Hepatic Neg liver ROS, GERD  Medicated and Controlled,  Endo/Other  Hypothyroidism   Renal/GU negative Renal ROS     Musculoskeletal  (+) Arthritis , Osteoarthritis,    Abdominal   Peds  Hematology   Anesthesia Other Findings   Reproductive/Obstetrics                             Anesthesia Physical Anesthesia Plan  ASA: II  Anesthesia Plan: MAC   Post-op Pain Management:    Induction: Intravenous  Airway Management Planned:   Additional Equipment:   Intra-op Plan:   Post-operative Plan:   Informed Consent: I have reviewed the patients History and Physical, chart, labs and discussed the procedure including the risks, benefits and alternatives for the proposed anesthesia with the patient or authorized representative who has indicated his/her understanding and acceptance.     Plan Discussed with: CRNA  Anesthesia Plan Comments:         Anesthesia Quick Evaluation

## 2015-05-10 NOTE — Anesthesia Postprocedure Evaluation (Signed)
Anesthesia Post Note  Patient: Louis Thomas  Procedure(s) Performed: Procedure(s) (LRB): COLONOSCOPY (N/A)  Patient location during evaluation: PACU Anesthesia Type: MAC Level of consciousness: awake and alert and oriented Pain management: pain level controlled Vital Signs Assessment: post-procedure vital signs reviewed and stable Respiratory status: spontaneous breathing and nonlabored ventilation Cardiovascular status: stable Postop Assessment: no signs of nausea or vomiting and adequate PO intake Anesthetic complications: no    Estill Batten

## 2015-05-10 NOTE — Anesthesia Procedure Notes (Signed)
Procedure Name: MAC Performed by: Saim Almanza Pre-anesthesia Checklist: Patient identified, Emergency Drugs available, Suction available, Timeout performed and Patient being monitored Patient Re-evaluated:Patient Re-evaluated prior to inductionOxygen Delivery Method: Nasal cannula Placement Confirmation: positive ETCO2     

## 2015-05-10 NOTE — H&P (Signed)
  Date of Initial H&P: 04/26/2015  History reviewed, patient examined, no change in status, stable for surgery.

## 2015-05-10 NOTE — Op Note (Signed)
[facility] Patient Name: Louis Thomas Procedure Date: 05/10/2015 12:10 PM MRN: [id number] Account #: [id number] Date of Birth: 22-Jan-1960 Admit Type: Outpatient Age: 55 Room: [facility] OR ROOM 01 Gender: Male Note Status: Finalized Procedure:            Colonoscopy Indications:          Screening for colorectal malignant neoplasm Providers:            Lupita Dawn. Candace Cruise, MD Referring MD:         Glendon Axe (Referring MD) Medicines:            Monitored Anesthesia Care Complications:        No immediate complications. Procedure:            Pre-Anesthesia Assessment:                       - Prior to the procedure, a History and Physical was                        performed, and patient medications, allergies and                        sensitivities were reviewed. The patient's tolerance of                        previous anesthesia was reviewed.                       - The risks and benefits of the procedure and the                        sedation options and risks were discussed with the                        patient. All questions were answered and informed                        consent was obtained.                       - After reviewing the risks and benefits, the patient                        was deemed in satisfactory condition to undergo the                        procedure.                       After obtaining informed consent, the colonoscope was                        passed under direct vision. Throughout the procedure,                        the patient's blood pressure, pulse, and oxygen                        saturations were monitored continuously. The Olympus CF  H180AL colonoscope (S#: [id number]) was introduced through                        the anus and advanced to the the cecum, identified by                        appendiceal orifice and ileocecal valve. The                        colonoscopy was performed without  difficulty. The                        patient tolerated the procedure well. The quality of                        the bowel preparation was fair. Findings:      Multiple small-mouthed diverticula were found in the entire colon.      The exam was otherwise without abnormality. Impression:           - Preparation of the colon was fair.                       - Diverticulosis in the entire examined colon.                       - The examination was otherwise normal.                       - No specimens collected. Recommendation:       - Discharge patient to home.                       - High fiber diet.                       - The findings and recommendations were discussed with                        the patient's family.                       - Repeat colonoscopy in 10 years for surveillance. Procedure Code(s):    --- Professional ---                       [id number], Colonoscopy, flexible; diagnostic, including                        collection of specimen(s) by brushing or washing, when                        performed (separate procedure) Diagnosis Code(s):    --- Professional ---                       Z12.11, Encounter for screening for malignant neoplasm                        of colon                       K57.30, Diverticulosis of large intestine without  perforation or abscess without bleeding CPT copyright 2016 [employer]. All rights reserved. The codes documented in this report are preliminary and upon coder review may  be revised to meet current compliance requirements. Hulen Luster, MD 05/10/2015 12:31:20 PM This report has been signed electronically. Number of Addenda: 0 Note Initiated On: 05/10/2015 12:10 PM Scope Withdrawal Time: 0 hours 6 minutes 56 seconds  Total Procedure Duration: 0 hours 10 minutes 14 seconds       [facility]

## 2015-05-10 NOTE — Transfer of Care (Signed)
Immediate Anesthesia Transfer of Care Note  Patient: Louis Thomas  Procedure(s) Performed: Procedure(s): COLONOSCOPY (N/A)  Patient Location: PACU  Anesthesia Type: MAC  Level of Consciousness: awake, alert  and patient cooperative  Airway and Oxygen Therapy: Patient Spontanous Breathing and Patient connected to supplemental oxygen  Post-op Assessment: Post-op Vital signs reviewed, Patient's Cardiovascular Status Stable, Respiratory Function Stable, Patent Airway and No signs of Nausea or vomiting  Post-op Vital Signs: Reviewed and stable  Complications: No apparent anesthesia complications

## 2015-05-11 ENCOUNTER — Encounter: Payer: Self-pay | Admitting: Gastroenterology

## 2016-02-13 ENCOUNTER — Other Ambulatory Visit: Payer: Self-pay | Admitting: Internal Medicine

## 2016-02-13 DIAGNOSIS — M5416 Radiculopathy, lumbar region: Secondary | ICD-10-CM

## 2016-03-06 ENCOUNTER — Ambulatory Visit
Admission: RE | Admit: 2016-03-06 | Discharge: 2016-03-06 | Disposition: A | Discharge status: Home / Self Care | Payer: BLUE CROSS/BLUE SHIELD | Source: Ambulatory Visit | Attending: Internal Medicine | Admitting: Internal Medicine

## 2016-03-06 DIAGNOSIS — M5126 Other intervertebral disc displacement, lumbar region: Secondary | ICD-10-CM | POA: Insufficient documentation

## 2016-03-06 DIAGNOSIS — G8929 Other chronic pain: Secondary | ICD-10-CM | POA: Insufficient documentation

## 2016-03-06 DIAGNOSIS — M5416 Radiculopathy, lumbar region: Secondary | ICD-10-CM | POA: Diagnosis present

## 2016-03-26 ENCOUNTER — Ambulatory Visit
Admission: RE | Admit: 2016-03-26 | Discharge: 2016-03-26 | Disposition: A | Discharge status: Home / Self Care | Payer: BLUE CROSS/BLUE SHIELD | Source: Ambulatory Visit | Attending: Neurological Surgery | Admitting: Neurological Surgery

## 2016-03-26 ENCOUNTER — Encounter
Admission: RE | Admit: 2016-03-26 | Discharge: 2016-03-26 | Disposition: A | Discharge status: Home / Self Care | Payer: BLUE CROSS/BLUE SHIELD | Source: Ambulatory Visit | Attending: Neurological Surgery | Admitting: Neurological Surgery

## 2016-03-26 DIAGNOSIS — Z01818 Encounter for other preprocedural examination: Secondary | ICD-10-CM | POA: Insufficient documentation

## 2016-03-26 DIAGNOSIS — I10 Essential (primary) hypertension: Secondary | ICD-10-CM

## 2016-03-26 DIAGNOSIS — Z01812 Encounter for preprocedural laboratory examination: Secondary | ICD-10-CM | POA: Insufficient documentation

## 2016-03-26 DIAGNOSIS — Z01810 Encounter for preprocedural cardiovascular examination: Secondary | ICD-10-CM | POA: Diagnosis not present

## 2016-03-26 HISTORY — DX: Personal history of urinary calculi: Z87.442

## 2016-03-26 LAB — BASIC METABOLIC PANEL
Anion gap: 8 (ref 5–15)
BUN: 13 mg/dL (ref 6–20)
CALCIUM: 9.7 mg/dL (ref 8.9–10.3)
CO2: 30 mmol/L (ref 22–32)
CREATININE: 1.09 mg/dL (ref 0.61–1.24)
Chloride: 99 mmol/L — ABNORMAL LOW (ref 101–111)
GFR calc Af Amer: >60 mL/min (ref >60)
GLUCOSE: 102 mg/dL — AB (ref 65–99)
Potassium: 3.6 mmol/L (ref 3.5–5.1)
Sodium: 137 mmol/L (ref 135–145)

## 2016-03-26 LAB — URINALYSIS, ROUTINE W REFLEX MICROSCOPIC
BILIRUBIN URINE: NEGATIVE
Glucose, UA: NEGATIVE mg/dL
Hgb urine dipstick: NEGATIVE
KETONES UR: NEGATIVE mg/dL
LEUKOCYTES UA: NEGATIVE
Nitrite: NEGATIVE
Protein, ur: NEGATIVE mg/dL
Specific Gravity, Urine: 1.023 (ref 1.005–1.030)
pH: 5 (ref 5.0–8.0)

## 2016-03-26 LAB — CBC
HEMATOCRIT: 49.3 % (ref 40.0–52.0)
HEMOGLOBIN: 16.4 g/dL (ref 13.0–18.0)
MCH: 29.6 pg (ref 26.0–34.0)
MCHC: 33.2 g/dL (ref 32.0–36.0)
MCV: 89.3 fL (ref 80.0–100.0)
Platelets: 367 10*3/uL (ref 150–440)
RBC: 5.53 MIL/uL (ref 4.40–5.90)
RDW: 14.7 % — AB (ref 11.5–14.5)
WBC: 8.2 10*3/uL (ref 3.8–10.6)

## 2016-03-26 LAB — APTT: APTT: 33 s (ref 24–36)

## 2016-03-26 LAB — DIFFERENTIAL
BASOS ABS: 0 10*3/uL (ref 0–0.1)
BASOS PCT: 1 %
EOS ABS: 0.1 10*3/uL (ref 0–0.7)
Eosinophils Relative: 1 %
LYMPHS ABS: 3.2 10*3/uL (ref 1.0–3.6)
Lymphocytes Relative: 39 %
MONOS PCT: 8 %
Monocytes Absolute: 0.7 10*3/uL (ref 0.2–1.0)
Neutro Abs: 4.2 10*3/uL (ref 1.4–6.5)
Neutrophils Relative %: 51 %

## 2016-03-26 LAB — SURGICAL PCR SCREEN
MRSA, PCR: NEGATIVE
STAPHYLOCOCCUS AUREUS: NEGATIVE

## 2016-03-26 LAB — PROTIME-INR
INR: 1.02
Prothrombin Time: 13.4 seconds (ref 11.4–15.2)

## 2016-03-26 NOTE — Patient Instructions (Signed)
  Your procedure is scheduled [id number] April2, 2018. Report to Same Day Surgery. To find out your arrival time please call [phone] between 1PM - 3PM on Friday March 30, 2016.  Remember: Instructions that are not followed completely may result in serious medical risk, up to and including death, or upon the discretion of your surgeon and anesthesiologist your surgery may need to be rescheduled.    _x___ 1. Do not eat food or drink liquids after midnight. No gum chewing or hard candies.     _x__ 2. No Alcohol for 24 hours before or after surgery.   ____ 3. Bring all medications with you on the day of surgery if instructed.    __x__ 4. Notify your doctor if there is any change in your medical condition     (cold, fever, infections).    _____ 5. No smoking 24 hours prior to surgery.     Do not wear jewelry, make-up, hairpins, clips or nail polish.  Do not wear lotions, powders, or perfumes.   Do not shave 48 hours prior to surgery. Men may shave face and neck.  Do not bring valuables to the hospital.    [facility] is not responsible for any belongings or valuables.               Contacts, dentures or bridgework may not be worn into surgery.  Leave your suitcase in the car. After surgery it may be brought to your room.  For patients admitted to the hospital, discharge time is determined by your treatment team.   Patients discharged the day of surgery will not be allowed to drive home.    Please read over the following fact sheets that you were given:   [facility] Preparing for Surgery  _x___ Take these medicines the morning of surgery with A SIP OF WATER:    1. amLODipine (NORVASC)  2. SYNTHROID  3. omeprazole (PRILOSEC)   4. FLUoxetine (PROZAC)     ____ Fleet Enema (as directed)   __x__ Use CHG Soap as directed on instruction sheet  ____ Use inhalers on the day of surgery and bring to hospital day of surgery  ____ Stop metformin 2 days prior to surgery    ____  Take 1/2 of usual insulin dose the night before surgery and none on the morning of  surgery.   ____ Stop Coumadin/Plavix/aspirin on does not apply.  _x___ Stop Anti-inflammatories such as Advil, Aleve, Ibuprofen, meloxicam (MOBIC),  Motrin, Naproxen, Naprosyn, Goodies powders or aspirin products. OK  to take Tylenol, Tramadol or Norco/Vicodin.   ____ Stop supplements until after surgery.    ____ Bring C-Pap to the hospital.

## 2016-03-26 NOTE — Pre-Procedure Instructions (Signed)
Spoke with Ophelia Shoulder at Dr. Sedalia Muta office, Dr. Aris Lot will add heart and lung assessment to H+P the day of surgery.

## 2016-04-01 MED ORDER — FAMOTIDINE 20 MG PO TABS
20.0000 mg | ORAL_TABLET | Freq: Once | ORAL | Status: AC
  Administered 2016-04-02: 20 mg via ORAL

## 2016-04-02 ENCOUNTER — Ambulatory Visit: Payer: BLUE CROSS/BLUE SHIELD | Admitting: Anesthesiology

## 2016-04-02 ENCOUNTER — Encounter: Payer: Self-pay | Admitting: *Deleted

## 2016-04-02 ENCOUNTER — Ambulatory Visit
Admission: RE | Admit: 2016-04-02 | Discharge: 2016-04-02 | Disposition: A | Discharge status: Home / Self Care | Payer: BLUE CROSS/BLUE SHIELD | Source: Ambulatory Visit | Attending: Neurological Surgery | Admitting: Neurological Surgery

## 2016-04-02 ENCOUNTER — Ambulatory Visit: Payer: BLUE CROSS/BLUE SHIELD

## 2016-04-02 ENCOUNTER — Encounter
Admission: RE | Disposition: A | Discharge status: Home / Self Care | Payer: Self-pay | Source: Ambulatory Visit | Attending: Neurological Surgery

## 2016-04-02 DIAGNOSIS — K219 Gastro-esophageal reflux disease without esophagitis: Secondary | ICD-10-CM | POA: Diagnosis not present

## 2016-04-02 DIAGNOSIS — Z791 Long term (current) use of non-steroidal anti-inflammatories (NSAID): Secondary | ICD-10-CM | POA: Insufficient documentation

## 2016-04-02 DIAGNOSIS — M5116 Intervertebral disc disorders with radiculopathy, lumbar region: Secondary | ICD-10-CM | POA: Diagnosis not present

## 2016-04-02 DIAGNOSIS — Z419 Encounter for procedure for purposes other than remedying health state, unspecified: Secondary | ICD-10-CM

## 2016-04-02 DIAGNOSIS — F329 Major depressive disorder, single episode, unspecified: Secondary | ICD-10-CM | POA: Diagnosis not present

## 2016-04-02 DIAGNOSIS — M4800 Spinal stenosis, site unspecified: Secondary | ICD-10-CM | POA: Insufficient documentation

## 2016-04-02 DIAGNOSIS — E890 Postprocedural hypothyroidism: Secondary | ICD-10-CM | POA: Diagnosis not present

## 2016-04-02 DIAGNOSIS — Z79899 Other long term (current) drug therapy: Secondary | ICD-10-CM | POA: Diagnosis not present

## 2016-04-02 DIAGNOSIS — I10 Essential (primary) hypertension: Secondary | ICD-10-CM | POA: Insufficient documentation

## 2016-04-02 HISTORY — PX: LUMBAR LAMINECTOMY/DECOMPRESSION MICRODISCECTOMY: SHX5026

## 2016-04-02 SURGERY — LUMBAR LAMINECTOMY/DECOMPRESSION MICRODISCECTOMY 1 LEVEL
Anesthesia: General | Laterality: Left

## 2016-04-02 MED ORDER — BUPIVACAINE-EPINEPHRINE (PF) 0.25% -1:200000 IJ SOLN
INTRAMUSCULAR | Status: DC | PRN
  Administered 2016-04-02: 10 mL

## 2016-04-02 MED ORDER — PROMETHAZINE HCL 25 MG/ML IJ SOLN
6.2500 mg | INTRAMUSCULAR | Status: DC | PRN
  Administered 2016-04-02: 6.25 mg via INTRAVENOUS

## 2016-04-02 MED ORDER — ROCURONIUM BROMIDE 50 MG/5ML IV SOLN
INTRAVENOUS | Status: AC
  Filled 2016-04-02: qty 1

## 2016-04-02 MED ORDER — OXYCODONE HCL 5 MG PO TABS
ORAL_TABLET | ORAL | Status: AC
  Filled 2016-04-02: qty 1

## 2016-04-02 MED ORDER — MIDAZOLAM HCL 2 MG/2ML IJ SOLN
INTRAMUSCULAR | Status: AC
  Filled 2016-04-02: qty 2

## 2016-04-02 MED ORDER — LIDOCAINE HCL (CARDIAC) 20 MG/ML IV SOLN
INTRAVENOUS | Status: DC | PRN
  Administered 2016-04-02: 80 mg via INTRAVENOUS

## 2016-04-02 MED ORDER — BACITRACIN 50000 UNITS IM SOLR
INTRAMUSCULAR | Status: DC | PRN
  Administered 2016-04-02: 5000 [IU]

## 2016-04-02 MED ORDER — METHYLPREDNISOLONE ACETATE 40 MG/ML IJ SUSP
INTRAMUSCULAR | Status: DC | PRN
  Administered 2016-04-02: 40 mg

## 2016-04-02 MED ORDER — METHOCARBAMOL 500 MG PO TABS: 500.0000 mg | ORAL_TABLET | Freq: Three times a day (TID) | ORAL | 0 refills | Status: DC

## 2016-04-02 MED ORDER — CEFAZOLIN SODIUM-DEXTROSE 2-4 GM/100ML-% IV SOLN
INTRAVENOUS | Status: AC
  Filled 2016-04-02: qty 100

## 2016-04-02 MED ORDER — SUCCINYLCHOLINE CHLORIDE 20 MG/ML IJ SOLN: INTRAMUSCULAR | Status: DC | PRN

## 2016-04-02 MED ORDER — FENTANYL CITRATE (PF) 100 MCG/2ML IJ SOLN
INTRAMUSCULAR | Status: DC | PRN
  Administered 2016-04-02 (×4): 50 ug via INTRAVENOUS

## 2016-04-02 MED ORDER — THROMBIN 5000 UNITS EX SOLR
CUTANEOUS | Status: DC | PRN
  Administered 2016-04-02: 5000 [IU] via TOPICAL

## 2016-04-02 MED ORDER — MIDAZOLAM HCL 2 MG/2ML IJ SOLN
INTRAMUSCULAR | Status: DC | PRN
  Administered 2016-04-02: 2 mg via INTRAVENOUS

## 2016-04-02 MED ORDER — FENTANYL CITRATE (PF) 100 MCG/2ML IJ SOLN
INTRAMUSCULAR | Status: AC
  Administered 2016-04-02: 25 ug via INTRAVENOUS
  Filled 2016-04-02: qty 2

## 2016-04-02 MED ORDER — PROMETHAZINE HCL 25 MG/ML IJ SOLN
INTRAMUSCULAR | Status: AC
  Administered 2016-04-02: 6.25 mg via INTRAVENOUS
  Filled 2016-04-02: qty 1

## 2016-04-02 MED ORDER — FAMOTIDINE 20 MG PO TABS
ORAL_TABLET | ORAL | Status: AC
  Filled 2016-04-02: qty 1

## 2016-04-02 MED ORDER — LIDOCAINE HCL (CARDIAC) 20 MG/ML IV SOLN: INTRAVENOUS | Status: DC | PRN

## 2016-04-02 MED ORDER — ONDANSETRON HCL 4 MG/2ML IJ SOLN
INTRAMUSCULAR | Status: AC
  Filled 2016-04-02: qty 2

## 2016-04-02 MED ORDER — OXYCODONE-ACETAMINOPHEN 7.5-325 MG PO TABS
1.0000 | ORAL_TABLET | ORAL | 0 refills | Status: DC | PRN
Start: 2016-04-02 — End: 2019-09-08

## 2016-04-02 MED ORDER — LACTATED RINGERS IV SOLN
INTRAVENOUS | Status: DC
  Administered 2016-04-02 (×3): via INTRAVENOUS

## 2016-04-02 MED ORDER — FENTANYL CITRATE (PF) 100 MCG/2ML IJ SOLN
25.0000 ug | INTRAMUSCULAR | Status: DC | PRN
Start: 2016-04-02 — End: 2016-04-02
  Administered 2016-04-02 (×5): 25 ug via INTRAVENOUS

## 2016-04-02 MED ORDER — PHENYLEPHRINE HCL 10 MG/ML IJ SOLN
INTRAMUSCULAR | Status: DC | PRN
  Administered 2016-04-02 (×4): 100 ug via INTRAVENOUS

## 2016-04-02 MED ORDER — BUPIVACAINE-EPINEPHRINE (PF) 0.25% -1:200000 IJ SOLN
INTRAMUSCULAR | Status: AC
  Filled 2016-04-02: qty 30

## 2016-04-02 MED ORDER — OXYCODONE-ACETAMINOPHEN 7.5-325 MG PO TABS
ORAL_TABLET | ORAL | Status: AC
  Filled 2016-04-02: qty 1

## 2016-04-02 MED ORDER — MEPERIDINE HCL 50 MG/ML IJ SOLN: 6.2500 mg | INTRAMUSCULAR | Status: DC | PRN

## 2016-04-02 MED ORDER — FENTANYL CITRATE (PF) 100 MCG/2ML IJ SOLN
INTRAMUSCULAR | Status: AC
  Filled 2016-04-02: qty 2

## 2016-04-02 MED ORDER — SODIUM CHLORIDE 0.9 % IJ SOLN
INTRAMUSCULAR | Status: AC
  Filled 2016-04-02: qty 10

## 2016-04-02 MED ORDER — SUCCINYLCHOLINE CHLORIDE 20 MG/ML IJ SOLN
INTRAMUSCULAR | Status: AC
  Filled 2016-04-02: qty 1

## 2016-04-02 MED ORDER — SUCCINYLCHOLINE CHLORIDE 20 MG/ML IJ SOLN
INTRAMUSCULAR | Status: DC | PRN
  Administered 2016-04-02: 140 mg via INTRAVENOUS

## 2016-04-02 MED ORDER — THROMBIN 5000 UNITS EX SOLR
CUTANEOUS | Status: AC
  Filled 2016-04-02: qty 10000

## 2016-04-02 MED ORDER — ROCURONIUM BROMIDE 100 MG/10ML IV SOLN
INTRAVENOUS | Status: DC | PRN
  Administered 2016-04-02: 10 mg via INTRAVENOUS
  Administered 2016-04-02: 45 mg via INTRAVENOUS
  Administered 2016-04-02: 10 mg via INTRAVENOUS
  Administered 2016-04-02: 5 mg via INTRAVENOUS

## 2016-04-02 MED ORDER — ONDANSETRON HCL 4 MG/2ML IJ SOLN
INTRAMUSCULAR | Status: DC | PRN
  Administered 2016-04-02: 4 mg via INTRAVENOUS

## 2016-04-02 MED ORDER — GELATIN ABSORBABLE 12-7 MM EX MISC
CUTANEOUS | Status: AC
  Filled 2016-04-02: qty 1

## 2016-04-02 MED ORDER — PROPOFOL 10 MG/ML IV BOLUS
INTRAVENOUS | Status: DC | PRN
  Administered 2016-04-02: 160 mg via INTRAVENOUS

## 2016-04-02 MED ORDER — OXYCODONE HCL 5 MG/5ML PO SOLN: 5.0000 mg | Freq: Once | ORAL | Status: AC | PRN

## 2016-04-02 MED ORDER — ACETAMINOPHEN 10 MG/ML IV SOLN
INTRAVENOUS | Status: DC | PRN
  Administered 2016-04-02: 1000 mg via INTRAVENOUS

## 2016-04-02 MED ORDER — OXYCODONE HCL 5 MG PO TABS
5.0000 mg | ORAL_TABLET | Freq: Once | ORAL | Status: AC | PRN
  Administered 2016-04-02: 5 mg via ORAL

## 2016-04-02 MED ORDER — BACITRACIN 50000 UNITS IM SOLR
INTRAMUSCULAR | Status: AC
  Filled 2016-04-02: qty 1

## 2016-04-02 MED ORDER — ACETAMINOPHEN 10 MG/ML IV SOLN
INTRAVENOUS | Status: AC
  Filled 2016-04-02: qty 100

## 2016-04-02 MED ORDER — CEFAZOLIN SODIUM-DEXTROSE 2-3 GM-% IV SOLR
2.0000 g | INTRAVENOUS | Status: AC
Start: 2016-04-02 — End: 2016-04-02
  Administered 2016-04-02: 2 g via INTRAVENOUS
  Filled 2016-04-02: qty 50

## 2016-04-02 MED ORDER — PROPOFOL 10 MG/ML IV BOLUS
INTRAVENOUS | Status: AC
  Filled 2016-04-02: qty 20

## 2016-04-02 MED ORDER — METHYLPREDNISOLONE ACETATE 40 MG/ML IJ SUSP
INTRAMUSCULAR | Status: AC
  Filled 2016-04-02: qty 1

## 2016-04-02 SURGICAL SUPPLY — 50 items
BAND RUBBER 3X1/6 TAN STRL (MISCELLANEOUS) IMPLANT
BUR NEURO DRILL SOFT 3.0X3.8M (BURR) ×3 IMPLANT
CANISTER SUCT 1200ML W/VALVE (MISCELLANEOUS) ×6 IMPLANT
CHLORAPREP W/TINT 26ML (MISCELLANEOUS) ×3 IMPLANT
CNTNR SPEC 2.5X3XGRAD LEK (MISCELLANEOUS) ×1
CONT SPEC 4OZ STER OR WHT (MISCELLANEOUS) ×2
CONTAINER SPEC 2.5X3XGRAD LEK (MISCELLANEOUS) ×1 IMPLANT
COUNTER NEEDLE 20/40 LG (NEEDLE) ×3 IMPLANT
COVER LIGHT HANDLE STERIS (MISCELLANEOUS) ×6 IMPLANT
DERMABOND ADVANCED (GAUZE/BANDAGES/DRESSINGS) ×2
DERMABOND ADVANCED .7 DNX12 (GAUZE/BANDAGES/DRESSINGS) ×1 IMPLANT
DRAPE C-ARM XRAY 36X54 (DRAPES) ×3 IMPLANT
DRAPE C-ARMOR (DRAPES) IMPLANT
DRAPE LAPAROTOMY 100X77 ABD (DRAPES) ×3 IMPLANT
DRAPE MICROSCOPE SPINE 48X150 (DRAPES) ×2 IMPLANT
DRAPE POUCH INSTRU U-SHP 10X18 (DRAPES) ×3 IMPLANT
DRAPE SURG 17X11 SM STRL (DRAPES) ×12 IMPLANT
ELECT CAUTERY BLADE TIP 2.5 (TIP) ×3
ELECT EZSTD 165MM 6.5IN (MISCELLANEOUS) ×3
ELECTRODE CAUTERY BLDE TIP 2.5 (TIP) ×1 IMPLANT
ELECTRODE EZSTD 165MM 6.5IN (MISCELLANEOUS) ×1 IMPLANT
GLOVE BIO SURGEON STRL SZ7.5 (GLOVE) ×6 IMPLANT
GLOVE BIOGEL PI IND STRL 8 (GLOVE) ×1 IMPLANT
GLOVE BIOGEL PI INDICATOR 8 (GLOVE) ×2
GOWN STRL REUS W/ TWL LRG LVL3 (GOWN DISPOSABLE) ×1 IMPLANT
GOWN STRL REUS W/ TWL XL LVL3 (GOWN DISPOSABLE) ×1 IMPLANT
GOWN STRL REUS W/TWL LRG LVL3 (GOWN DISPOSABLE) ×2
GOWN STRL REUS W/TWL XL LVL3 (GOWN DISPOSABLE) ×2
GRADUATE 1200CC STRL 31836 (MISCELLANEOUS) ×3 IMPLANT
KIT SPINAL PRONEVIEW (KITS) ×2 IMPLANT
KNIFE BAYONET SHORT DISCETOMY (MISCELLANEOUS) ×2 IMPLANT
MARKER SKIN DUAL TIP RULER LAB (MISCELLANEOUS) ×6 IMPLANT
NDL SAFETY ECLIPSE 18X1.5 (NEEDLE) ×3 IMPLANT
NEEDLE HYPO 18GX1.5 SHARP (NEEDLE) ×6
NS IRRIG 1000ML POUR BTL (IV SOLUTION) ×3 IMPLANT
PACK LAMINECTOMY NEURO (CUSTOM PROCEDURE TRAY) ×3 IMPLANT
SPOGE SURGIFLO 8M (HEMOSTASIS) ×2
SPONGE SURGIFLO 8M (HEMOSTASIS) ×1 IMPLANT
SUT MNCRL AB 3-0 PS2 27 (SUTURE) IMPLANT
SUT NURALON 4 0 TR CR/8 (SUTURE) IMPLANT
SUT VIC AB 2-0 CT1 18 (SUTURE) ×3 IMPLANT
SUT VICRYL 0 UR6 27IN ABS (SUTURE) ×6 IMPLANT
SYR 3ML LL SCALE MARK (SYRINGE) ×3 IMPLANT
SYRINGE 10CC LL (SYRINGE) ×3 IMPLANT
TOWEL OR 17X26 4PK STRL BLUE (TOWEL DISPOSABLE) ×9 IMPLANT
TUBE MATRX SPINL 18MM 7CM DISP (INSTRUMENTS) ×2
TUBE METRX SPINAL 18X7 DISP (INSTRUMENTS) ×1 IMPLANT
TUBING CONNECTING 10 (TUBING) ×2 IMPLANT
TUBING CONNECTING 10' (TUBING) ×1
WATER STERILE IRR 1000ML POUR (IV SOLUTION) ×3 IMPLANT

## 2016-04-02 NOTE — Anesthesia Post-op Follow-up Note (Cosign Needed)
Anesthesia QCDR form completed.        

## 2016-04-02 NOTE — Transfer of Care (Signed)
Immediate Anesthesia Transfer of Care Note  Patient: Louis Thomas  Procedure(s) Performed: Procedure(s): LEFT L4-5 MIS MICRODISCECTOMY (Left)  Patient Location: PACU  Anesthesia Type:General  Level of Consciousness: awake and sedated  Airway & Oxygen Therapy: Patient Spontanous Breathing and Patient connected to face mask oxygen  Post-op Assessment: Report given to RN and Post -op Vital signs reviewed and stable  Post vital signs: Reviewed and stable  Last Vitals:  Vitals:   04/02/16 0608  BP: (!) 142/94  Pulse: (!) 114  Resp: 16  Temp: 37 C    Last Pain:  Vitals:   04/02/16 0608  TempSrc: Tympanic  PainSc: 7          Complications: No apparent anesthesia complications

## 2016-04-02 NOTE — Discharge Instructions (Signed)

## 2016-04-02 NOTE — Anesthesia Postprocedure Evaluation (Signed)
Anesthesia Post Note  Patient: Louis Thomas  Procedure(s) Performed: Procedure(s) (LRB): LEFT L4-5 MIS MICRODISCECTOMY (Left)  Patient location during evaluation: PACU Anesthesia Type: General Level of consciousness: awake and alert and oriented Pain management: pain level controlled Vital Signs Assessment: post-procedure vital signs reviewed and stable Respiratory status: spontaneous breathing, nonlabored ventilation and respiratory function stable Cardiovascular status: blood pressure returned to baseline and stable Postop Assessment: no signs of nausea or vomiting Anesthetic complications: no     Last Vitals:  Vitals:   04/02/16 1047 04/02/16 1056  BP: 129/82 125/75  Pulse:    Resp: 18 18  Temp: 36.6 C     Last Pain:  Vitals:   04/02/16 1048  TempSrc:   PainSc: Asleep                 Bralynn Velador

## 2016-04-02 NOTE — Anesthesia Procedure Notes (Signed)
Procedure Name: Intubation Date/Time: 04/02/2016 7:45 AM Performed by: Allean Found Pre-anesthesia Checklist: Patient identified, Emergency Drugs available, Suction available, Patient being monitored and Timeout performed Patient Re-evaluated:Patient Re-evaluated prior to inductionOxygen Delivery Method: Circle system utilized Preoxygenation: Pre-oxygenation with 100% oxygen Intubation Type: IV induction Ventilation: Mask ventilation without difficulty Laryngoscope Size: McGraph and 4 Grade View: Grade II Tube type: Oral Tube size: 7.5 mm Number of attempts: 1 Airway Equipment and Method: Stylet Placement Confirmation: ETT inserted through vocal cords under direct vision,  positive ETCO2 and breath sounds checked- equal and bilateral Secured at: 23 cm Tube secured with: Tape Dental Injury: Teeth and Oropharynx as per pre-operative assessment

## 2016-04-02 NOTE — H&P (Signed)
Referring Physician:  Glendon Axe, MD [facility] [place], [place] [address]  Primary Physician:  Glendon Axe, MD  Chief Complaint: Left leg pain  History of Present Illness: Louis Thomas is a 56 y.o. male who presents with the chief complaint of approximately 16-20 month history of left back and leg pain that he attributes to his time at work reengage in repetitive lifting bending motions. The pain is sharp and has progressed despite conservative measures with physical therapy. The pain extends along the posterior lateral aspect of his leg and shin region and can extend to his foot where he also has corresponding numbness at times. He denies significant symptoms on the right but does have right buttock pain now that is developed since December. He denies any loss of bowel or bladder function or weakness. He states he cannot ambulate Piche distances secondary to pain. He has difficulty finding a position that provides relief. He denies any foot drop. He denies any prior lumbar spine surgery. He denies any use of tobacco products. He had an MRI in September 2016 which showed a disc protrusion at L4-5 which has significantly increased in that interval. A time prompting consultation today  Review of Systems:  A 10 point review of systems is negative, except for the pertinent positives and negatives detailed in the HPI.  Past Medical History: Past Medical History:  Diagnosis Date  . Depression, unspecified  . Diverticulosis 05/10/2015  . Essential hypertension, benign  . Goiter  multinodular  . History of kidney stones  . Hyperlipidemia, unspecified  . Hypertension  . Multinodular goiter  . Osteoarthritis  . Other and unspecified hyperlipidemia  . Unspecified hypothyroidism  . Unspecified hypothyroidism   Past Surgical History: Past Surgical History:  Procedure Laterality Date  . COLONOSCOPY 05/10/2015  Diverticulosis/The colon was otherwise normal/Repeat  64yrs/[facility]  . KNEE ARTHROSCOPY  . left knee surgery 1999  . removal of multinodular goiter  by Dr. Duffy Rhody  . THYROIDECTOMY TOTAL 11/2012  [facility]  . VASECTOMY   Allergies: Allergies as of 03/20/2016 - Reviewed 03/20/2016  Allergen Reaction Noted  . Sulfa (sulfonamide antibiotics) Rash 05/13/2013   Medications: Outpatient Encounter Prescriptions as of 03/20/2016  Medication Sig Dispense Refill  . HYDROCODONE-ACETAMINOPHEN ORAL Take by mouth every 8 (eight) hours as needed.  Marland Kitchen amLODIPine (NORVASC) 10 MG tablet TAKE 1 TABLET DAILY 90 tablet 1  . ARIPiprazole (ABILIFY) 2 MG tablet Take 1 tablet by mouth nightly.  . cholecalciferol (CHOLECALCIFEROL) 1,000 unit tablet Take 1,000 Units by mouth.  Marland Kitchen FLUoxetine (PROZAC) 40 MG capsule TAKE 2 CAPSULES EVERY DAY 1  . levothyroxine (SYNTHROID, LEVOTHROID) 150 MCG tablet TAKE 1 TABLET DAILY ON AN EMPTY STOMACH WITH A GLASS OF WATER AT LEAST 30 TO 60 MINUTES BEFORE BREAKFAST 90 tablet 1  . lisinopril-hydrochlorothiazide (PRINZIDE,ZESTORETIC) 20-25 mg tablet TAKE 1 TABLET BY MOUTH ONCE DAILY. DISCONTINUE HCTZ 90 tablet 0  . meloxicam (MOBIC) 15 MG tablet TAKE 1 TABLET (15 MG TOTAL) BY MOUTH ONCE DAILY. 30 tablet 5  . omeprazole (PRILOSEC) 40 MG DR capsule TAKE 1 CAPSULE (40 MG TOTAL) BY MOUTH ONCE DAILY. 90 capsule 1  . tiZANidine (ZANAFLEX) 4 MG tablet TAKE 1 TABLET (4 MG TOTAL) BY MOUTH NIGHTLY AS NEEDED FOR MUSCLE SPASMS. 30 tablet 1  . traMADol (ULTRAM) 50 mg tablet TAKE 1 OR 2 TABLETS BY MOUTH 3 TIMES A DAY AS NEEDED 60 tablet 2  . [DISCONTINUED] azithromycin (ZITHROMAX) 250 MG tablet Take 2 tablets (500mg ) by  mouth on Day 1. Take 1 tablet (250mg ) by mouth on Days 2-5. 6 tablet 0  . [DISCONTINUED] FLUoxetine (PROZAC) 20 MG capsule Take 60 mg by mouth once daily.  0   No facility-administered encounter medications on file as of 03/20/2016.   Social History: Social History  Substance Use Topics  . Smoking status: Never Smoker  . Smokeless  tobacco: Never Used  . Alcohol use 0.5 oz/week  1 Standard drinks or equivalent per week   Family Medical History: Family History  Problem Relation Age of Onset  . Diabetes type II Mother  . Stroke Mother  . Osteoarthritis Mother  . Myocardial Infarction (Heart attack) Father  . High blood pressure (Hypertension) Other  . Heart disease Other  . Bipolar disorder Other  . Hyperthyroidism Other  . Depression Brother   Physical Examination: Vitals:  03/20/16 1057  BP: (!) 144/91  Pulse: 100  Weight: (!) 118.8 kg (261 lb 12.8 oz)  Height: 182.9 cm (6')  PainSc: 5  PainLoc: Back   General: Patient is well developed, well nourished, calm, collected, and in no apparent distress.  Psychiatric: Patient is non-anxious.  Head: Pupils equal, round, and reactive to light.  ENT: Oral mucosa appears well hydrated.  Neck: Supple. Full range of motion.  Respiratory: Patient is breathing without any difficulty.  Extremities: No edema.  Vascular: Palpable pulses.  Skin: On exposed skin, there are no abnormal skin lesions.  NEUROLOGICAL:  General: In no acute distress.  Awake, alert, oriented to person, place, and time. Pupils equal round and reactive to light. Facial tone is symmetric. Tongue protrusion is midline. There is no pronator drift.  ROM of spine: limited Palpation of spine: TTP L5 left paraspinal  Strength: Side Biceps Triceps Deltoid Interossei Grip Wrist Ext. Wrist Flex.  R 5 5 5 5 5 5 5   L 5 5 5 5 5 5 5    Side Iliopsoas Quads Hamstring PF DF EHL  R 5 5 5 5 5 5   L 5 5 5 5  4+ 5   Chest: Clear bilaterally CV: S1/S2 auscultated RRR  Reflexes are 2+ and symmetric at the biceps, triceps, brachioradialis, patella and achilles. Bilateral upper and lower extremity sensation is intact to light touch and pin prick (mixed in left L4 and L5 dermatomes). Clonus is not present. Toes are down-going. Gait is normal. Hoffman's is absent.  Imaging: EXAM: MRI LUMBAR SPINE  WITHOUT CONTRAST  TECHNIQUE: Multiplanar, multisequence MR imaging of the lumbar spine was performed. No intravenous contrast was administered.  COMPARISON:MRI dated 09/03/2014  FINDINGS: Segmentation:Standard.  Alignment:Physiologic.  Vertebrae: Prominent Schmorl's node in the superior endplate of L4, slightly progressed.  Conus medullaris: Extends to the L1 level and appears normal.  Paraspinal and other soft tissues: No acute abnormality. Right kidney is not visible.  Disc levels:  L1-2:Normal.  L2-3: Slight disc desiccation. Right foraminal and extraforaminal disc protrusion with slight mass effect upon the right L2 nerve lateral to the neural foramen, slightly progressed since the prior study.  L3-4: Disc desiccation. Tiny broad-based disc bulge with no neural impingement, unchanged.  L4-5: Large progressed central soft disc extrusion slightly asymmetric to the left markedly compressing the thecal sac asymmetric to the left. This should affect the left L5 and more distal nerve roots as well as right-sided nerve roots.  L5-S1:Normal.  IMPRESSION: 1. Large progressed soft disc extrusion at L4-5 central and to the left severely compressing the thecal sac slightly asymmetric to the left. 2. Right foraminal and extraforaminal  disc protrusion at L2-3 with a slight mass effect upon the right L2 nerve lateral to the neural foramen, slightly progressed.  I have personally reviewed the images and agree with the above interpretation.  Assessment and Plan: Mr. Louis Thomas is a pleasant 56 y.o. male with evidence of a L4/5 disc protrusion with significant radiculopathy that is failed conservative measures and it showed radiographic and clinical progression. I have recommended and offered him a left L4/5 minimally invasive approach for microdiscectomy and decompression. I have also discussed with him that if unable to obtain significant decompression from this large disc  that is midline with eccentric to the left that would also need a right sided hemilaminotomy through a separate incision in the same surgery. I discussed the risk and benefits as well as alternatives. He has opted to proceed with surgical intervention.

## 2016-04-02 NOTE — Anesthesia Preprocedure Evaluation (Signed)
Anesthesia Evaluation  Patient identified by MRN, date of birth, ID band Patient awake    Reviewed: Allergy & Precautions, NPO status , Patient's Chart, lab work & pertinent test results  History of Anesthesia Complications Negative for: history of anesthetic complications  Airway Mallampati: IV  TM Distance: >3 FB Neck ROM: Full    Dental no notable dental hx.    Pulmonary neg pulmonary ROS, neg sleep apnea, neg COPD,    breath sounds clear to auscultation- rhonchi (-) wheezing      Cardiovascular Exercise Tolerance: Good hypertension, Pt. on medications (-) CAD and (-) Past MI  Rhythm:Regular Rate:Normal - Systolic murmurs and - Diastolic murmurs    Neuro/Psych PSYCHIATRIC DISORDERS Depression negative neurological ROS     GI/Hepatic Neg liver ROS, GERD  ,  Endo/Other  neg diabetesHypothyroidism   Renal/GU negative Renal ROS     Musculoskeletal  (+) Arthritis ,   Abdominal (+) + obese,   Peds  Hematology negative hematology ROS (+)   Anesthesia Other Findings Past Medical History: No date: Arthritis     Comment: shoulders, lower back, knees No date: Depression No date: GERD (gastroesophageal reflux disease) No date: History of kidney stones No date: Hyperlipidemia No date: Hypertension No date: Hypothyroid No date: Spinal stenosis     Comment: lower back   Reproductive/Obstetrics                             Anesthesia Physical Anesthesia Plan  ASA: III  Anesthesia Plan: General   Post-op Pain Management:    Induction: Intravenous  Airway Management Planned: Oral ETT and Video Laryngoscope Planned  Additional Equipment:   Intra-op Plan:   Post-operative Plan: Extubation in OR  Informed Consent: I have reviewed the patients History and Physical, chart, labs and discussed the procedure including the risks, benefits and alternatives for the proposed anesthesia with the  patient or authorized representative who has indicated his/her understanding and acceptance.   Dental advisory given  Plan Discussed with: CRNA and Anesthesiologist  Anesthesia Plan Comments:         Anesthesia Quick Evaluation

## 2016-04-02 NOTE — Op Note (Signed)
Date of Surgery: 04/02/2016  Attending: Dr. 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   Risks of surgery discussed include: infection, bleeding, stroke, coma, death, paralysis, CSF leak, nerve/spinal cord injury, numbness, tingling, weakness, complex regional pain syndrome, recurrent stenosis and/or disc herniation, vascular injury, development of instability, neck/back pain, need for further surgery, persistent symptoms, development of deformity, and the risks of anesthesia. They understood these risks and have agreed to proceed.  Operative Note:   The patient was then brought from the preoperative center with intravenous access established.  They underwent general anesthesia and endotracheal tube intubation.  They were then rotated on the [place] rail top where all pressure points were appropriately padded.  The skin was then thoroughly cleansed.  Perioperative antibiotic prophylaxis was administered.  Sterile prep and drapes were then applied and a timeout was then observed.  C-arm was brought into the field under sterile conditions and under AP visualization with the use of a K wire we established anatomic view the midline spinous process established with a marking pen.  As well as the medial pedicle boundaries.  Once these lines were established we then rotated into the lateral position and the corresponding level was identified counting from the sacrum with the use of a local anesthetic needle.  Once this was complete a 2 cm incision was opened with the use of  a #10 blade knife.  The Metrx tubes were sequentially advanced under live lateral fluoroscopy until a #18 tube by70 mm depth was established the final tube was then locked in place to the bed side attachment.  Fluoroscopy was then removed from the field.  The microscope was then sterilely brought into the field and muscle creep was hemostased with a bipolar and resected with a pituitary rongeur.  A Bovie extender was then used to expose the spinous process and lamina.  Careful attention was placed to not violate the facet capsule a 3 mm matchstick drill bit was then used to make a hemi-laminotomy trough until the ligamentum flavum was exposed.  This was extended to the base of the spinous process.  Once this was complete and the underlying ligamentum flavum was visualized this was dissected with a 6-0 up angle curette and resected with a #2 #3 biting Kerrison.  The laminotomy opening was also expanded in similar fashion and hemostasis was obtained with Surgifoam and a patty as well as bone wax.  The rostral aspect of the caudal level of the lamina was also resected with a #2 biting Kerrison effort to further enhance exposure.  Once the underlying dura was visualized a Penfield 4 was then used to dissect and expose the traversing nerve root.  Once this was identified a nerve root retractor was used to mobilize this medially.  The venous plexus was hemostased with Surgifoam and light bipolar use.  #15 blade knife was then used to make a small annulotomy within the disc space and disc space contents were noted to to come through the annulus.  This was resected with a pituitary rongeur.  A 6-0 up-angled curette was passed within the disc space to further mobilize disc space  fragments as well as a right angle micro ball-tipped feeler.  Further displace contents were removed in similar fashion with a pituitary rongeur.  Once the thecal sac and nerve root were noted to be relaxed and under less tension the ball-tipped  feeler was passed along the foramen distally to to ensure no residual compression was noted.  With none noted attention was then turned to closure.  Prior to this a Valsalva maneuver was performed and no evidence of CSF leak was noted.  A Depo-Medrol soaked Gelfoam pledget was placed along the annulotomy.  The tube system was then removed under microscopic visualization and hemostasis was obtained with a bipolar.  The fascial layer was reapproximated with the use of a 0- Vicryl suture.  Subcutaneous tissue layer was reapproximated using 2-0 Vicryl suture.  The skin was then cleansed and Dermabond was used to close the skin opening.  Patient was then rotated back to the preoperative bed awakened from anesthesia and taken to recovery all counts are correct in this case.   Era Bumpers, MD

## 2016-04-03 ENCOUNTER — Encounter: Payer: Self-pay | Admitting: Neurological Surgery

## 2016-04-13 DIAGNOSIS — R7303 Prediabetes: Secondary | ICD-10-CM | POA: Insufficient documentation

## 2016-07-14 DIAGNOSIS — J302 Other seasonal allergic rhinitis: Secondary | ICD-10-CM | POA: Insufficient documentation

## 2016-10-17 ENCOUNTER — Other Ambulatory Visit: Payer: Self-pay | Admitting: Neurological Surgery

## 2016-10-17 DIAGNOSIS — Z9889 Other specified postprocedural states: Secondary | ICD-10-CM

## 2016-10-23 ENCOUNTER — Ambulatory Visit
Admission: RE | Admit: 2016-10-23 | Discharge: 2016-10-23 | Disposition: A | Discharge status: Home / Self Care | Payer: BLUE CROSS/BLUE SHIELD | Source: Ambulatory Visit | Attending: Neurological Surgery | Admitting: Neurological Surgery

## 2016-10-23 DIAGNOSIS — Z9889 Other specified postprocedural states: Secondary | ICD-10-CM | POA: Insufficient documentation

## 2016-10-23 DIAGNOSIS — M5126 Other intervertebral disc displacement, lumbar region: Secondary | ICD-10-CM | POA: Insufficient documentation

## 2016-10-23 DIAGNOSIS — G8929 Other chronic pain: Secondary | ICD-10-CM | POA: Diagnosis not present

## 2016-10-23 DIAGNOSIS — M48061 Spinal stenosis, lumbar region without neurogenic claudication: Secondary | ICD-10-CM | POA: Insufficient documentation

## 2016-10-23 DIAGNOSIS — M5441 Lumbago with sciatica, right side: Secondary | ICD-10-CM | POA: Diagnosis not present

## 2016-10-23 DIAGNOSIS — M5442 Lumbago with sciatica, left side: Secondary | ICD-10-CM | POA: Insufficient documentation

## 2016-11-19 DIAGNOSIS — R3129 Other microscopic hematuria: Secondary | ICD-10-CM | POA: Insufficient documentation

## 2016-11-20 ENCOUNTER — Other Ambulatory Visit: Payer: Self-pay | Admitting: Internal Medicine

## 2016-11-20 DIAGNOSIS — R1011 Right upper quadrant pain: Secondary | ICD-10-CM

## 2016-11-26 ENCOUNTER — Ambulatory Visit
Admission: RE | Admit: 2016-11-26 | Discharge: 2016-11-26 | Disposition: A | Discharge status: Home / Self Care | Payer: BLUE CROSS/BLUE SHIELD | Source: Ambulatory Visit | Attending: Internal Medicine | Admitting: Internal Medicine

## 2016-11-26 DIAGNOSIS — R1011 Right upper quadrant pain: Secondary | ICD-10-CM

## 2016-11-26 DIAGNOSIS — K8020 Calculus of gallbladder without cholecystitis without obstruction: Secondary | ICD-10-CM | POA: Diagnosis not present

## 2016-11-26 DIAGNOSIS — K760 Fatty (change of) liver, not elsewhere classified: Secondary | ICD-10-CM | POA: Diagnosis not present

## 2016-12-06 ENCOUNTER — Ambulatory Visit: Payer: Self-pay | Admitting: General Surgery

## 2016-12-06 NOTE — H&P (View-Only) (Signed)
PATIENT PROFILE: Louis Thomas is a 56 y.o. male who presents to the Clinic for consultation at the request of Dr. Candiss Thomas for evaluation of cholelithiasis.  PCP:  Louis Axe, MD  HISTORY OF PRESENT ILLNESS: Mr. Louis Thomas reports having right upper quadrant pain since several months ago. The patient refer he has been dealing with abdominal pain since 3-4 years ago and was treated with omeprazole. I the last few months the pain has localized to the right upper quadrant and has been more frequently and more intense. Pain does not radiates to the back or scapula. Omeprazole was helping to improve pain at the beginning but is not helping anymore. Pain is related to food intake which activates the pain. Having small meals help to decrease the intensity of the pain. Denies episodes of fever or chills.    PROBLEM LIST: Problem List  Date Reviewed: 12/04/2016         Noted   Hematuria, microscopic 11/19/2016   DDD (degenerative disc disease), lumbosacral 10/02/2016   Seasonal allergic rhinitis 07/14/2016   Prediabetes 04/13/2016   Chronic bilateral low back pain with left-sided sciatica 05/13/2015   Hyperglycemia, unspecified 05/13/2015   DDD (degenerative disc disease), lumbar 04/01/2015   HNP (herniated nucleus pulposus), lumbar 02/02/2015   Lumbar stenosis with neurogenic claudication 02/02/2015   Lumbar radiculitis 02/02/2015   Primary osteoarthritis of both shoulders 01/25/2015   Acute pain of both shoulders 01/12/2015   Vitamin D deficiency, unspecified 11/26/2014   Acute left-sided low back pain with left-sided sciatica 09/08/2014   Chronic left-sided low back pain without sciatica 04/22/2014   Primary osteoarthritis of left knee 04/22/2014   Mixed hyperlipidemia 02/12/2014   Gastroesophageal reflux disease without esophagitis 02/12/2014   Essential hypertension 02/12/2014   Hypothyroidism  05/13/2013   Hyperlipidemia  05/13/2013   Low back pain  05/13/2013      GENERAL REVIEW OF SYSTEMS:   General ROS:  negative for - chills, fever, weight gain or weight loss. Positive for depression Allergy and Immunology ROS: negative for - hives  Hematological and Lymphatic ROS: negative for - bleeding problems or bruising, negative for palpable nodes Endocrine ROS: negative for - heat or cold intolerance, hair changes Respiratory ROS: negative for - cough, shortness of breath or wheezing Cardiovascular ROS: no chest pain or palpitations GI ROS: negative for nausea, vomiting, diarrhea, constipation. See HPI for pertinent positives.  Musculoskeletal ROS: negative for - joint swelling or muscle pain Neurological ROS: negative for - confusion, syncope Dermatological ROS: negative for pruritus and rash. Bilateral axillary healed cysts.   MEDICATIONS: Current Outpatient Medications  Medication Sig Dispense Refill  . amLODIPine (NORVASC) 10 MG tablet TAKE 1 TABLET DAILY 90 tablet 1  . ARIPiprazole (ABILIFY) 2 MG tablet Take 1 tablet by mouth nightly.    Marland Kitchen azithromycin (ZITHROMAX) 250 MG tablet Take 2 tablets (562m) by mouth on Day 1. Take 1 tablet (2560m by mouth on Days 2-5. 6 tablet 0  . BIOFREEZE, MENTHOL, TOP Apply topically.    . cholecalciferol (CHOLECALCIFEROL) 1,000 unit tablet Take 1,000 Units by mouth.    . Marland KitchenLUoxetine (PROZAC) 40 MG capsule TAKE 2 CAPSULES EVERY DAY  1  . levothyroxine (SYNTHROID, LEVOTHROID) 150 MCG tablet TAKE 1 TABLET DAILY ON AN EMPTY STOMACH WITH A GLASS OF WATER AT LEAST 30 TO 60 MINUTES BEFORE BREAKFAST 90 tablet 1  . lisinopril-hydrochlorothiazide (PRINZIDE,ZESTORETIC) 20-25 mg tablet Take 1 tablet by mouth once daily. discontinue HCTZ 90 tablet 3  . meloxicam (MOBIC) 15 MG tablet  Take 1 tablet (15 mg total) by mouth once daily. 90 tablet 3  . omeprazole (PRILOSEC) 40 MG DR capsule Take 1 capsule (40 mg total) by mouth once daily 90 capsule 3  . tiZANidine (ZANAFLEX) 4 MG tablet TAKE 1 TABLET (4 MG TOTAL) BY MOUTH NIGHTLY AS NEEDED FOR MUSCLE SPASMS. 30 tablet 1  . traMADol  (ULTRAM) 50 mg tablet TAKE 1 TO 2 TABLETS BY MOUTH 3 TIMES A DAY AS NEEDED 60 tablet 3   No current facility-administered medications for this visit.     ALLERGIES: Sulfa (sulfonamide antibiotics) and Sulfasalazine  PAST MEDICAL HISTORY: Past Medical History:  Diagnosis Date  . Depression, unspecified   . Diverticulosis 05/10/2015  . Essential hypertension, benign   . Goiter    multinodular  . History of kidney stones   . Hyperlipidemia, unspecified   . Hypertension   . Multinodular goiter   . Osteoarthritis   . Other and unspecified hyperlipidemia   . Unspecified hypothyroidism   . Unspecified hypothyroidism     PAST SURGICAL HISTORY: Past Surgical History:  Procedure Laterality Date  . COLONOSCOPY  05/10/2015   Diverticulosis/The colon was otherwise normal/Repeat 27yr/PYO  . KNEE ARTHROSCOPY    . L4-5 MIS microdiscectomy  04/02/2016   Dr JEra Thomas . left knee surgery  1999  . removal of multinodular goiter     by Dr. LDuffy Thomas . THYROIDECTOMY TOTAL  11/2012   [facility]  . VASECTOMY       FAMILY HISTORY: Family History  Problem Relation Age of Onset  . Diabetes type II Mother   . Stroke Mother   . Osteoarthritis Mother   . Myocardial Infarction (Heart attack) Father   . High blood pressure (Hypertension) Other   . Heart disease Other   . Bipolar disorder Other   . Hyperthyroidism Other   . Depression Brother      SOCIAL HISTORY: Social History   Socioeconomic History  . Marital status: Married    Spouse name: None  . Number of children: 2  . Years of education: 152 . Highest education level: None  Social Needs  . Financial resource strain: None  . Food insecurity - worry: None  . Food insecurity - inability: None  . Transportation needs - medical: None  . Transportation needs - non-medical: None  Occupational History  . None  Tobacco Use  . Smoking status: Never Smoker  . Smokeless tobacco: Never Used  Substance and Sexual Activity  .  Alcohol use: Yes    Alcohol/week: 0.5 oz    Types: 1 Standard drinks or equivalent per week    Binge frequency: Monthly  . Drug use: No  . Sexual activity: Defer  Other Topics Concern  . None  Social History Narrative  . None    PHYSICAL EXAM: Vitals:   12/06/16 0829  BP: 134/85  Pulse: 91  Temp: 36.8 C (98.3 F)   Body mass index is 35.67 kg/m. Weight: (!) 119.3 kg (263 lb)   General Appearance:    Alert, cooperative, no distress, appears stated age  Head:     Atraumatic, normocephalic  Eyes:   Anciteric, no erythema, no secretions  Neck:   Supple, symmetrical, no JVD, no palpable lymph nodes  Mouth:   Lips, mucosa, and tongue normal;   Lungs:     Clear to auscultation bilaterally, respirations unlabored   Heart:    Regular rate and rhythm, S1 and S2 normal, no murmur, rub  or gallop  Abdomen:     Soft, non-tender, bowel sounds active all four quadrants,    no masses, no organomegaly  Extremities:   Extremities normal, atraumatic, no cyanosis or edema. Bilateral axillary chronic cysts  Skin:   Skin color, texture, turgor normal, no rashes or lesions   Neurologic:   Grossly intact.    REVIEW OF DATA: I have reviewed the following data today: Office Visit on 11/19/2016  Component Date Value  . H pylori Breath Test - Louis* 11/19/2016 Negative   . WBC (White Blood Cell Co* 11/19/2016 7.6   . RBC (Red Blood Cell Coun* 11/19/2016 5.28   . Hemoglobin 11/19/2016 15.8   . Hematocrit 11/19/2016 47.9   . MCV (Mean Corpuscular Vo* 11/19/2016 90.7   . MCH (Mean Corpuscular He* 11/19/2016 29.9   . MCHC (Mean Corpuscular H* 11/19/2016 33.0   . Platelet Count 11/19/2016 373   . RDW-CV (Red Cell Distrib* 11/19/2016 13.4   . MPV (Mean Platelet Volum* 11/19/2016 9.2*  . Neutrophils 11/19/2016 4.01   . Lymphocytes 11/19/2016 2.93   . Monocytes 11/19/2016 0.51   . Eosinophils 11/19/2016 0.10   . Basophils 11/19/2016 0.03   . Neutrophil % 11/19/2016 52.7   . Lymphocyte %  11/19/2016 38.6   . Monocyte % 11/19/2016 6.7   . Eosinophil % 11/19/2016 1.3   . Basophil% 11/19/2016 0.4   . Immature Granulocyte % 11/19/2016 0.3   . Immature Granulocyte Cou* 11/19/2016 0.02   . Glucose 11/19/2016 92   . Sodium 11/19/2016 141   . Potassium 11/19/2016 4.4   . Chloride 11/19/2016 102   . Carbon Dioxide (CO2) 11/19/2016 28.3   . Urea Nitrogen (BUN) 11/19/2016 9   . Creatinine 11/19/2016 1.1   . Glomerular Filtration Ra* 11/19/2016 84   . Calcium 11/19/2016 9.9   . AST  11/19/2016 15   . ALT  11/19/2016 23   . Alk Phos (alkaline Phosp* 11/19/2016 54   . Albumin 11/19/2016 4.8   . Bilirubin, Total 11/19/2016 0.6   . Protein, Total 11/19/2016 7.4   . A/G Ratio 11/19/2016 1.8   . Color 11/19/2016 Yellow   . Clarity 11/19/2016 Clear   . Specific Gravity 11/19/2016 1.025   . pH, Urine 11/19/2016 6.0   . Protein, Urinalysis 11/19/2016 Negative   . Glucose, Urinalysis 11/19/2016 Negative   . Ketones, Urinalysis 11/19/2016 Negative   . Blood, Urinalysis 11/19/2016 Small*  . Nitrite, Urinalysis 11/19/2016 Negative   . Leukocyte Esterase, Urin* 11/19/2016 Negative   . White Blood Cells, Urina* 11/19/2016 0-3   . Red Blood Cells, Urinaly* 11/19/2016 4-10*  . Bacteria, Urinalysis 11/19/2016 None Seen   . Squamous Epithelial Cell* 11/19/2016 Few   . Amylase 11/19/2016 43   . Lipase 11/19/2016 25     Abdominal ultrasound images reviewed and discussed with patient: patient showed about the stones in the gallbladder, the wall of the gallbladder and the absence of fluid around the gallbladder.    ASSESSMENT: Mr. Jungbluth is a 56 y.o. male presenting for consultation for cholelithiasis. Patient presents with symptoms related to gallstones (right upper quadrant, associated with food intake). Patient was oriented about the diagnosis of cholelithiasis, implications of having gallstones, treatment alternatives (observation vs cholecystectomy), Surgical techniques (open vs  laparoscopic), benefits and risk of surgery such as (bile duct injury, bile leak, retained stones, injury to liver, small bowel, stomach or any adjacent organ, infection, bleeding and anesthesia complications.) Patient agree to proceed with surgery.   PLAN:  1. Laparoscopic cholecystectomy CPT: 47562 2. Avoid fatty and spicy food 3. Do not take aspirin or any blood thinner 7 days prior to surgery 4. Internal Medicine clearance.   Patient verbalized understanding, all questions were answered, and were agreeable with the plan outlined above.   Herbert Pun, MD  Electronically signed by Herbert Pun, MD

## 2016-12-06 NOTE — H&P (Signed)
PATIENT PROFILE: Louis Thomas is a 56 y.o. male who presents to the Clinic for consultation at the request of Dr. Candiss Norse for evaluation of cholelithiasis.  PCP:  Louis Axe, MD  HISTORY OF PRESENT ILLNESS: Louis Thomas reports having right upper quadrant pain since several months ago. The patient refer he has been dealing with abdominal pain since 3-4 years ago and was treated with omeprazole. I the last few months the pain has localized to the right upper quadrant and has been more frequently and more intense. Pain does not radiates to the back or scapula. Omeprazole was helping to improve pain at the beginning but is not helping anymore. Pain is related to food intake which activates the pain. Having small meals help to decrease the intensity of the pain. Denies episodes of fever or chills.    PROBLEM LIST: Problem List  Date Reviewed: 12/04/2016         Noted   Hematuria, microscopic 11/19/2016   DDD (degenerative disc disease), lumbosacral 10/02/2016   Seasonal allergic rhinitis 07/14/2016   Prediabetes 04/13/2016   Chronic bilateral low back pain with left-sided sciatica 05/13/2015   Hyperglycemia, unspecified 05/13/2015   DDD (degenerative disc disease), lumbar 04/01/2015   HNP (herniated nucleus pulposus), lumbar 02/02/2015   Lumbar stenosis with neurogenic claudication 02/02/2015   Lumbar radiculitis 02/02/2015   Primary osteoarthritis of both shoulders 01/25/2015   Acute pain of both shoulders 01/12/2015   Vitamin D deficiency, unspecified 11/26/2014   Acute left-sided low back pain with left-sided sciatica 09/08/2014   Chronic left-sided low back pain without sciatica 04/22/2014   Primary osteoarthritis of left knee 04/22/2014   Mixed hyperlipidemia 02/12/2014   Gastroesophageal reflux disease without esophagitis 02/12/2014   Essential hypertension 02/12/2014   Hypothyroidism  05/13/2013   Hyperlipidemia  05/13/2013   Low back pain  05/13/2013      GENERAL REVIEW OF SYSTEMS:   General ROS:  negative for - chills, fever, weight gain or weight loss. Positive for depression Allergy and Immunology ROS: negative for - hives  Hematological and Lymphatic ROS: negative for - bleeding problems or bruising, negative for palpable nodes Endocrine ROS: negative for - heat or cold intolerance, hair changes Respiratory ROS: negative for - cough, shortness of breath or wheezing Cardiovascular ROS: no chest pain or palpitations GI ROS: negative for nausea, vomiting, diarrhea, constipation. See HPI for pertinent positives.  Musculoskeletal ROS: negative for - joint swelling or muscle pain Neurological ROS: negative for - confusion, syncope Dermatological ROS: negative for pruritus and rash. Bilateral axillary healed cysts.   MEDICATIONS: Current Outpatient Medications  Medication Sig Dispense Refill  . amLODIPine (NORVASC) 10 MG tablet TAKE 1 TABLET DAILY 90 tablet 1  . ARIPiprazole (ABILIFY) 2 MG tablet Take 1 tablet by mouth nightly.    Marland Kitchen azithromycin (ZITHROMAX) 250 MG tablet Take 2 tablets (562m) by mouth on Day 1. Take 1 tablet (2582m by mouth on Days 2-5. 6 tablet 0  . BIOFREEZE, MENTHOL, TOP Apply topically.    . cholecalciferol (CHOLECALCIFEROL) 1,000 unit tablet Take 1,000 Units by mouth.    . Marland KitchenLUoxetine (PROZAC) 40 MG capsule TAKE 2 CAPSULES EVERY DAY  1  . levothyroxine (SYNTHROID, LEVOTHROID) 150 MCG tablet TAKE 1 TABLET DAILY ON AN EMPTY STOMACH WITH A GLASS OF WATER AT LEAST 30 TO 60 MINUTES BEFORE BREAKFAST 90 tablet 1  . lisinopril-hydrochlorothiazide (PRINZIDE,ZESTORETIC) 20-25 mg tablet Take 1 tablet by mouth once daily. discontinue HCTZ 90 tablet 3  . meloxicam (MOBIC) 15 MG tablet  Take 1 tablet (15 mg total) by mouth once daily. 90 tablet 3  . omeprazole (PRILOSEC) 40 MG DR capsule Take 1 capsule (40 mg total) by mouth once daily 90 capsule 3  . tiZANidine (ZANAFLEX) 4 MG tablet TAKE 1 TABLET (4 MG TOTAL) BY MOUTH NIGHTLY AS NEEDED FOR MUSCLE SPASMS. 30 tablet 1  . traMADol  (ULTRAM) 50 mg tablet TAKE 1 TO 2 TABLETS BY MOUTH 3 TIMES A DAY AS NEEDED 60 tablet 3   No current facility-administered medications for this visit.     ALLERGIES: Sulfa (sulfonamide antibiotics) and Sulfasalazine  PAST MEDICAL HISTORY: Past Medical History:  Diagnosis Date  . Depression, unspecified   . Diverticulosis 05/10/2015  . Essential hypertension, benign   . Goiter    multinodular  . History of kidney stones   . Hyperlipidemia, unspecified   . Hypertension   . Multinodular goiter   . Osteoarthritis   . Other and unspecified hyperlipidemia   . Unspecified hypothyroidism   . Unspecified hypothyroidism     PAST SURGICAL HISTORY: Past Surgical History:  Procedure Laterality Date  . COLONOSCOPY  05/10/2015   Diverticulosis/The colon was otherwise normal/Repeat 69yr/PYO  . KNEE ARTHROSCOPY    . L4-5 MIS microdiscectomy  04/02/2016   Dr JEra Bumpers . left knee surgery  1999  . removal of multinodular goiter     by Dr. LDuffy Rhody . THYROIDECTOMY TOTAL  11/2012   [facility]  . VASECTOMY       FAMILY HISTORY: Family History  Problem Relation Age of Onset  . Diabetes type II Mother   . Stroke Mother   . Osteoarthritis Mother   . Myocardial Infarction (Heart attack) Father   . High blood pressure (Hypertension) Other   . Heart disease Other   . Bipolar disorder Other   . Hyperthyroidism Other   . Depression Brother      SOCIAL HISTORY: Social History   Socioeconomic History  . Marital status: Married    Spouse name: None  . Number of children: 2  . Years of education: 156 . Highest education level: None  Social Needs  . Financial resource strain: None  . Food insecurity - worry: None  . Food insecurity - inability: None  . Transportation needs - medical: None  . Transportation needs - non-medical: None  Occupational History  . None  Tobacco Use  . Smoking status: Never Smoker  . Smokeless tobacco: Never Used  Substance and Sexual Activity  .  Alcohol use: Yes    Alcohol/week: 0.5 oz    Types: 1 Standard drinks or equivalent per week    Binge frequency: Monthly  . Drug use: No  . Sexual activity: Defer  Other Topics Concern  . None  Social History Narrative  . None    PHYSICAL EXAM: Vitals:   12/06/16 0829  BP: 134/85  Pulse: 91  Temp: 36.8 C (98.3 F)   Body mass index is 35.67 kg/m. Weight: (!) 119.3 kg (263 lb)   General Appearance:    Alert, cooperative, no distress, appears stated age  Head:     Atraumatic, normocephalic  Eyes:   Anciteric, no erythema, no secretions  Neck:   Supple, symmetrical, no JVD, no palpable lymph nodes  Mouth:   Lips, mucosa, and tongue normal;   Lungs:     Clear to auscultation bilaterally, respirations unlabored   Heart:    Regular rate and rhythm, S1 and S2 normal, no murmur, rub  or gallop  Abdomen:     Soft, non-tender, bowel sounds active all four quadrants,    no masses, no organomegaly  Extremities:   Extremities normal, atraumatic, no cyanosis or edema. Bilateral axillary chronic cysts  Skin:   Skin color, texture, turgor normal, no rashes or lesions   Neurologic:   Grossly intact.    REVIEW OF DATA: I have reviewed the following data today: Office Visit on 11/19/2016  Component Date Value  . H pylori Breath Test - L* 11/19/2016 Negative   . WBC (White Blood Cell Co* 11/19/2016 7.6   . RBC (Red Blood Cell Coun* 11/19/2016 5.28   . Hemoglobin 11/19/2016 15.8   . Hematocrit 11/19/2016 47.9   . MCV (Mean Corpuscular Vo* 11/19/2016 90.7   . MCH (Mean Corpuscular He* 11/19/2016 29.9   . MCHC (Mean Corpuscular H* 11/19/2016 33.0   . Platelet Count 11/19/2016 373   . RDW-CV (Red Cell Distrib* 11/19/2016 13.4   . MPV (Mean Platelet Volum* 11/19/2016 9.2*  . Neutrophils 11/19/2016 4.01   . Lymphocytes 11/19/2016 2.93   . Monocytes 11/19/2016 0.51   . Eosinophils 11/19/2016 0.10   . Basophils 11/19/2016 0.03   . Neutrophil % 11/19/2016 52.7   . Lymphocyte %  11/19/2016 38.6   . Monocyte % 11/19/2016 6.7   . Eosinophil % 11/19/2016 1.3   . Basophil% 11/19/2016 0.4   . Immature Granulocyte % 11/19/2016 0.3   . Immature Granulocyte Cou* 11/19/2016 0.02   . Glucose 11/19/2016 92   . Sodium 11/19/2016 141   . Potassium 11/19/2016 4.4   . Chloride 11/19/2016 102   . Carbon Dioxide (CO2) 11/19/2016 28.3   . Urea Nitrogen (BUN) 11/19/2016 9   . Creatinine 11/19/2016 1.1   . Glomerular Filtration Ra* 11/19/2016 84   . Calcium 11/19/2016 9.9   . AST  11/19/2016 15   . ALT  11/19/2016 23   . Alk Phos (alkaline Phosp* 11/19/2016 54   . Albumin 11/19/2016 4.8   . Bilirubin, Total 11/19/2016 0.6   . Protein, Total 11/19/2016 7.4   . A/G Ratio 11/19/2016 1.8   . Color 11/19/2016 Yellow   . Clarity 11/19/2016 Clear   . Specific Gravity 11/19/2016 1.025   . pH, Urine 11/19/2016 6.0   . Protein, Urinalysis 11/19/2016 Negative   . Glucose, Urinalysis 11/19/2016 Negative   . Ketones, Urinalysis 11/19/2016 Negative   . Blood, Urinalysis 11/19/2016 Small*  . Nitrite, Urinalysis 11/19/2016 Negative   . Leukocyte Esterase, Urin* 11/19/2016 Negative   . White Blood Cells, Urina* 11/19/2016 0-3   . Red Blood Cells, Urinaly* 11/19/2016 4-10*  . Bacteria, Urinalysis 11/19/2016 None Seen   . Squamous Epithelial Cell* 11/19/2016 Few   . Amylase 11/19/2016 43   . Lipase 11/19/2016 25     Abdominal ultrasound images reviewed and discussed with patient: patient showed about the stones in the gallbladder, the wall of the gallbladder and the absence of fluid around the gallbladder.    ASSESSMENT: Mr. Sauceda is a 56 y.o. male presenting for consultation for cholelithiasis. Patient presents with symptoms related to gallstones (right upper quadrant, associated with food intake). Patient was oriented about the diagnosis of cholelithiasis, implications of having gallstones, treatment alternatives (observation vs cholecystectomy), Surgical techniques (open vs  laparoscopic), benefits and risk of surgery such as (bile duct injury, bile leak, retained stones, injury to liver, small bowel, stomach or any adjacent organ, infection, bleeding and anesthesia complications.) Patient agree to proceed with surgery.   PLAN:  1. Laparoscopic cholecystectomy CPT: 47562 2. Avoid fatty and spicy food 3. Do not take aspirin or any blood thinner 7 days prior to surgery 4. Internal Medicine clearance.   Patient verbalized understanding, all questions were answered, and were agreeable with the plan outlined above.   Herbert Pun, MD  Electronically signed by Herbert Pun, MD

## 2016-12-07 ENCOUNTER — Inpatient Hospital Stay: Admission: RE | Admit: 2016-12-07 | Payer: BLUE CROSS/BLUE SHIELD | Source: Ambulatory Visit

## 2016-12-11 ENCOUNTER — Inpatient Hospital Stay: Admission: RE | Admit: 2016-12-11 | Payer: BLUE CROSS/BLUE SHIELD | Source: Ambulatory Visit

## 2016-12-13 ENCOUNTER — Other Ambulatory Visit: Payer: Self-pay

## 2016-12-13 ENCOUNTER — Encounter
Admission: RE | Admit: 2016-12-13 | Discharge: 2016-12-13 | Disposition: A | Discharge status: Home / Self Care | Payer: BLUE CROSS/BLUE SHIELD | Source: Ambulatory Visit | Attending: General Surgery | Admitting: General Surgery

## 2016-12-13 DIAGNOSIS — Z01810 Encounter for preprocedural cardiovascular examination: Secondary | ICD-10-CM | POA: Insufficient documentation

## 2016-12-13 DIAGNOSIS — I10 Essential (primary) hypertension: Secondary | ICD-10-CM | POA: Insufficient documentation

## 2016-12-13 HISTORY — DX: Sleep apnea, unspecified: G47.30

## 2016-12-13 NOTE — Patient Instructions (Signed)
Your procedure is scheduled on: Monday 12/17/16 Report to [place]. 2ND FLOOR MEDICAL MALL ENTRANCE. To find out your arrival time please call [phone] between 1PM - 3PM on Friday 12/14/16.  Remember: Instructions that are not followed completely may result in serious medical risk, up to and including death, or upon the discretion of your surgeon and anesthesiologist your surgery may need to be rescheduled.    __X__ 1. Do not eat anything after midnight the night before your    procedure.  No gum chewing or hard candies.  You may drink clear   liquids up to 2 hours before you are scheduled to arrive at the   hospital for your procedure. Do not drink clear liquids within 2   hours of scheduled arrival to the hospital as this may lead to your   procedure being delayed or rescheduled.       Clear liquids include:   Water or Apple juice without pulp   Clear carbohydrate beverage such as Clearfast or Gatorade   Black coffee or Clear Tea (no milk, no creamer, do not add anything   to the coffee or tea)    Diabetics should only drink water   __X__ 2. No Alcohol for 24 hours before or after surgery.   ____ 3. Bring all medications with you on the day of surgery if instructed.    __X__ 4. Notify your doctor if there is any change in your medical condition     (cold, fever, infections).             __X___5. No smoking within 24 hours of your surgery.     Do not wear jewelry, make-up, hairpins, clips or nail polish.  Do not wear lotions, powders, or perfumes.   Do not shave 48 hours prior to surgery. Men may shave face and neck.  Do not bring valuables to the hospital.    [facility] is not responsible for any belongings or valuables.               Contacts, dentures or bridgework may not be worn into surgery.  Leave your suitcase in the car. After surgery it may be brought to your room.  For patients admitted to the hospital, discharge time is determined by your                 treatment team.   Patients discharged the day of surgery will not be allowed to drive home.   Please read over the following fact sheets that you were given:   MRSA Information   __X__ Take these medicines the morning of surgery with A SIP OF WATER:    1. L-THYROXINE  2. OMEPRAZOLE  3. AMLODIPINE  4.  5.  6.  ____ Fleet Enema (as directed)   __X__ Use CHG Soap/SAGE wipes as directed  ____ Use inhalers on the day of surgery  ____ Stop metformin 2 days prior to surgery    ____ Take 1/2 of usual insulin dose the night before surgery and none on the morning of surgery.   ____ Stop Coumadin/Plavix/aspirin on   __X__ Stop Anti-inflammatories such as Advil, Aleve, Ibuprofen, Motrin, Naproxen, Naprosyn, Goodies,powder, or aspirin products.  OK to take Tylenol.   __X__ Stop supplements, Vitamin E, Fish Oil until after surgery.    ____ Bring C-Pap to the hospital.

## 2016-12-16 MED ORDER — CEFAZOLIN SODIUM-DEXTROSE 2-4 GM/100ML-% IV SOLN
2.0000 g | INTRAVENOUS | Status: AC
  Administered 2016-12-17: 2 g via INTRAVENOUS

## 2016-12-17 ENCOUNTER — Encounter
Admission: RE | Disposition: A | Discharge status: Home / Self Care | Payer: Self-pay | Source: Ambulatory Visit | Attending: General Surgery

## 2016-12-17 ENCOUNTER — Ambulatory Visit: Payer: BLUE CROSS/BLUE SHIELD | Admitting: Anesthesiology

## 2016-12-17 ENCOUNTER — Ambulatory Visit
Admission: RE | Admit: 2016-12-17 | Discharge: 2016-12-17 | Disposition: A | Discharge status: Home / Self Care | Payer: BLUE CROSS/BLUE SHIELD | Source: Ambulatory Visit | Attending: General Surgery | Admitting: General Surgery

## 2016-12-17 ENCOUNTER — Encounter: Payer: Self-pay | Admitting: Emergency Medicine

## 2016-12-17 DIAGNOSIS — K8010 Calculus of gallbladder with chronic cholecystitis without obstruction: Secondary | ICD-10-CM | POA: Diagnosis not present

## 2016-12-17 DIAGNOSIS — Z791 Long term (current) use of non-steroidal anti-inflammatories (NSAID): Secondary | ICD-10-CM | POA: Insufficient documentation

## 2016-12-17 DIAGNOSIS — F329 Major depressive disorder, single episode, unspecified: Secondary | ICD-10-CM | POA: Diagnosis not present

## 2016-12-17 DIAGNOSIS — Z79899 Other long term (current) drug therapy: Secondary | ICD-10-CM | POA: Diagnosis not present

## 2016-12-17 DIAGNOSIS — I10 Essential (primary) hypertension: Secondary | ICD-10-CM | POA: Diagnosis not present

## 2016-12-17 DIAGNOSIS — Z8249 Family history of ischemic heart disease and other diseases of the circulatory system: Secondary | ICD-10-CM | POA: Diagnosis not present

## 2016-12-17 DIAGNOSIS — K219 Gastro-esophageal reflux disease without esophagitis: Secondary | ICD-10-CM | POA: Insufficient documentation

## 2016-12-17 DIAGNOSIS — E782 Mixed hyperlipidemia: Secondary | ICD-10-CM | POA: Diagnosis not present

## 2016-12-17 DIAGNOSIS — Z79891 Long term (current) use of opiate analgesic: Secondary | ICD-10-CM | POA: Insufficient documentation

## 2016-12-17 DIAGNOSIS — Z823 Family history of stroke: Secondary | ICD-10-CM | POA: Insufficient documentation

## 2016-12-17 DIAGNOSIS — M5116 Intervertebral disc disorders with radiculopathy, lumbar region: Secondary | ICD-10-CM | POA: Diagnosis not present

## 2016-12-17 DIAGNOSIS — Z87442 Personal history of urinary calculi: Secondary | ICD-10-CM | POA: Insufficient documentation

## 2016-12-17 DIAGNOSIS — M48062 Spinal stenosis, lumbar region with neurogenic claudication: Secondary | ICD-10-CM | POA: Diagnosis not present

## 2016-12-17 DIAGNOSIS — Z882 Allergy status to sulfonamides status: Secondary | ICD-10-CM | POA: Insufficient documentation

## 2016-12-17 DIAGNOSIS — R7303 Prediabetes: Secondary | ICD-10-CM | POA: Insufficient documentation

## 2016-12-17 DIAGNOSIS — K8020 Calculus of gallbladder without cholecystitis without obstruction: Secondary | ICD-10-CM | POA: Diagnosis present

## 2016-12-17 DIAGNOSIS — Z818 Family history of other mental and behavioral disorders: Secondary | ICD-10-CM | POA: Diagnosis not present

## 2016-12-17 DIAGNOSIS — G8929 Other chronic pain: Secondary | ICD-10-CM | POA: Diagnosis not present

## 2016-12-17 DIAGNOSIS — Z9889 Other specified postprocedural states: Secondary | ICD-10-CM | POA: Insufficient documentation

## 2016-12-17 DIAGNOSIS — Z833 Family history of diabetes mellitus: Secondary | ICD-10-CM | POA: Insufficient documentation

## 2016-12-17 DIAGNOSIS — Z8349 Family history of other endocrine, nutritional and metabolic diseases: Secondary | ICD-10-CM | POA: Insufficient documentation

## 2016-12-17 DIAGNOSIS — E890 Postprocedural hypothyroidism: Secondary | ICD-10-CM | POA: Diagnosis not present

## 2016-12-17 DIAGNOSIS — Z8261 Family history of arthritis: Secondary | ICD-10-CM | POA: Insufficient documentation

## 2016-12-17 DIAGNOSIS — Z79890 Hormone replacement therapy (postmenopausal): Secondary | ICD-10-CM | POA: Diagnosis not present

## 2016-12-17 DIAGNOSIS — Z9852 Vasectomy status: Secondary | ICD-10-CM | POA: Insufficient documentation

## 2016-12-17 DIAGNOSIS — M5137 Other intervertebral disc degeneration, lumbosacral region: Secondary | ICD-10-CM | POA: Diagnosis not present

## 2016-12-17 HISTORY — PX: CHOLECYSTECTOMY: SHX55

## 2016-12-17 SURGERY — LAPAROSCOPIC CHOLECYSTECTOMY
Anesthesia: General | Site: Abdomen | Wound class: Clean Contaminated

## 2016-12-17 MED ORDER — BUPIVACAINE-EPINEPHRINE (PF) 0.5% -1:200000 IJ SOLN
INTRAMUSCULAR | Status: AC
  Filled 2016-12-17: qty 30

## 2016-12-17 MED ORDER — ONDANSETRON HCL 4 MG/2ML IJ SOLN
4.0000 mg | Freq: Once | INTRAMUSCULAR | Status: AC | PRN
  Administered 2016-12-17: 4 mg via INTRAVENOUS

## 2016-12-17 MED ORDER — PROPOFOL 10 MG/ML IV BOLUS
INTRAVENOUS | Status: DC | PRN
  Administered 2016-12-17: 180 mg via INTRAVENOUS

## 2016-12-17 MED ORDER — BUPIVACAINE-EPINEPHRINE 0.5% -1:200000 IJ SOLN
INTRAMUSCULAR | Status: DC | PRN
  Administered 2016-12-17: 12 mL

## 2016-12-17 MED ORDER — KETOROLAC TROMETHAMINE 30 MG/ML IJ SOLN
INTRAMUSCULAR | Status: DC | PRN
Start: 2016-12-17 — End: 2016-12-17
  Administered 2016-12-17: 5 mg via INTRAVENOUS

## 2016-12-17 MED ORDER — FENTANYL CITRATE (PF) 100 MCG/2ML IJ SOLN
INTRAMUSCULAR | Status: DC | PRN
  Administered 2016-12-17 (×4): 50 ug via INTRAVENOUS

## 2016-12-17 MED ORDER — HYDROCODONE-ACETAMINOPHEN 5-325 MG PO TABS: 1.0000 | ORAL_TABLET | ORAL | 0 refills | Status: AC | PRN

## 2016-12-17 MED ORDER — SUCCINYLCHOLINE CHLORIDE 20 MG/ML IJ SOLN
INTRAMUSCULAR | Status: DC | PRN
  Administered 2016-12-17: 100 mg via INTRAVENOUS

## 2016-12-17 MED ORDER — ONDANSETRON HCL 4 MG/2ML IJ SOLN
INTRAMUSCULAR | Status: AC
  Filled 2016-12-17: qty 2

## 2016-12-17 MED ORDER — ROCURONIUM BROMIDE 100 MG/10ML IV SOLN
INTRAVENOUS | Status: DC | PRN
  Administered 2016-12-17: 30 mg via INTRAVENOUS
  Administered 2016-12-17 (×2): 10 mg via INTRAVENOUS

## 2016-12-17 MED ORDER — ONDANSETRON HCL 4 MG/2ML IJ SOLN
INTRAMUSCULAR | Status: DC | PRN
  Administered 2016-12-17: 4 mg via INTRAVENOUS

## 2016-12-17 MED ORDER — ROCURONIUM BROMIDE 50 MG/5ML IV SOLN
INTRAVENOUS | Status: AC
  Filled 2016-12-17: qty 1

## 2016-12-17 MED ORDER — SUGAMMADEX SODIUM 200 MG/2ML IV SOLN
INTRAVENOUS | Status: DC | PRN
  Administered 2016-12-17: 234 mg via INTRAVENOUS

## 2016-12-17 MED ORDER — LIDOCAINE HCL (PF) 2 % IJ SOLN
INTRAMUSCULAR | Status: AC
  Filled 2016-12-17: qty 10

## 2016-12-17 MED ORDER — LIDOCAINE HCL (CARDIAC) 20 MG/ML IV SOLN
INTRAVENOUS | Status: DC | PRN
  Administered 2016-12-17: 100 mg via INTRAVENOUS

## 2016-12-17 MED ORDER — LACTATED RINGERS IV SOLN
INTRAVENOUS | Status: DC
  Administered 2016-12-17 (×2): via INTRAVENOUS

## 2016-12-17 MED ORDER — DEXAMETHASONE SODIUM PHOSPHATE 10 MG/ML IJ SOLN
INTRAMUSCULAR | Status: DC | PRN
  Administered 2016-12-17: 5 mg via INTRAVENOUS

## 2016-12-17 MED ORDER — FENTANYL CITRATE (PF) 100 MCG/2ML IJ SOLN
INTRAMUSCULAR | Status: AC
  Filled 2016-12-17: qty 2

## 2016-12-17 MED ORDER — SUCCINYLCHOLINE CHLORIDE 20 MG/ML IJ SOLN
INTRAMUSCULAR | Status: AC
  Filled 2016-12-17: qty 1

## 2016-12-17 MED ORDER — DEXAMETHASONE SODIUM PHOSPHATE 10 MG/ML IJ SOLN
INTRAMUSCULAR | Status: AC
  Filled 2016-12-17: qty 1

## 2016-12-17 MED ORDER — ONDANSETRON HCL 4 MG/2ML IJ SOLN
INTRAMUSCULAR | Status: AC
  Administered 2016-12-17: 4 mg via INTRAVENOUS
  Filled 2016-12-17: qty 2

## 2016-12-17 MED ORDER — FENTANYL CITRATE (PF) 100 MCG/2ML IJ SOLN
INTRAMUSCULAR | Status: AC
  Administered 2016-12-17: 25 ug via INTRAVENOUS
  Filled 2016-12-17: qty 2

## 2016-12-17 MED ORDER — HYDROCODONE-ACETAMINOPHEN 7.5-325 MG PO TABS: 1.0000 | ORAL_TABLET | Freq: Four times a day (QID) | ORAL | Status: DC | PRN

## 2016-12-17 MED ORDER — ACETAMINOPHEN 10 MG/ML IV SOLN
INTRAVENOUS | Status: DC | PRN
  Administered 2016-12-17: 1000 mg via INTRAVENOUS

## 2016-12-17 MED ORDER — KETOROLAC TROMETHAMINE 30 MG/ML IJ SOLN
INTRAMUSCULAR | Status: AC
  Filled 2016-12-17: qty 1

## 2016-12-17 MED ORDER — CEFAZOLIN SODIUM-DEXTROSE 2-4 GM/100ML-% IV SOLN
INTRAVENOUS | Status: AC
  Filled 2016-12-17: qty 100

## 2016-12-17 MED ORDER — FENTANYL CITRATE (PF) 100 MCG/2ML IJ SOLN
25.0000 ug | INTRAMUSCULAR | Status: DC | PRN
  Administered 2016-12-17 (×4): 25 ug via INTRAVENOUS

## 2016-12-17 SURGICAL SUPPLY — 44 items
APPLIER CLIP 5 13 M/L LIGAMAX5 (MISCELLANEOUS) ×3
APPLIER CLIP LAPSCP 10X32 DD (CLIP) ×3 IMPLANT
BLADE CLIPPER SURG (BLADE) ×3 IMPLANT
BLADE SURG SZ11 CARB STEEL (BLADE) ×3 IMPLANT
CANISTER SUCT 1200ML W/VALVE (MISCELLANEOUS) ×3 IMPLANT
CHLORAPREP W/TINT 26ML (MISCELLANEOUS) ×3 IMPLANT
CLIP APPLIE 5 13 M/L LIGAMAX5 (MISCELLANEOUS) ×1 IMPLANT
DERMABOND ADVANCED (GAUZE/BANDAGES/DRESSINGS) ×2
DERMABOND ADVANCED .7 DNX12 (GAUZE/BANDAGES/DRESSINGS) ×1 IMPLANT
DRAPE SHEET LG 3/4 BI-LAMINATE (DRAPES) ×3 IMPLANT
ELECT E-Z MONOPOLAR 33 (MISCELLANEOUS) ×3
ELECT REM PT RETURN 9FT ADLT (ELECTROSURGICAL) ×3
ELECTRODE E-Z MONOPOLAR 33 (MISCELLANEOUS) ×1 IMPLANT
ELECTRODE REM PT RTRN 9FT ADLT (ELECTROSURGICAL) ×1 IMPLANT
GAUZE SPONGE 4X4 12PLY STRL (GAUZE/BANDAGES/DRESSINGS) ×3 IMPLANT
GLOVE BIO SURGEON STRL SZ 6.5 (GLOVE) ×2 IMPLANT
GLOVE BIO SURGEONS STRL SZ 6.5 (GLOVE) ×1
GOWN STRL REUS W/ TWL LRG LVL3 (GOWN DISPOSABLE) ×4 IMPLANT
GOWN STRL REUS W/TWL LRG LVL3 (GOWN DISPOSABLE) ×8
GRASPER SUT TROCAR 14GX15 (MISCELLANEOUS) IMPLANT
HEMOSTAT SURGICEL 2X14 (HEMOSTASIS) ×3 IMPLANT
IRRIGATION STRYKERFLOW (MISCELLANEOUS) IMPLANT
IRRIGATOR STRYKERFLOW (MISCELLANEOUS)
IV NS 1000ML (IV SOLUTION) ×2
IV NS 1000ML BAXH (IV SOLUTION) ×1 IMPLANT
KIT RM TURNOVER STRD PROC AR (KITS) ×3 IMPLANT
L-HOOK LAP DISP 36CM (ELECTROSURGICAL) ×3
LABEL OR SOLS (LABEL) ×3 IMPLANT
LHOOK LAP DISP 36CM (ELECTROSURGICAL) ×1 IMPLANT
NDL SAFETY 25GX1.5 (NEEDLE) ×3 IMPLANT
NEEDLE INSUFFLATION 14GA 120MM (NEEDLE) ×3 IMPLANT
NS IRRIG 500ML POUR BTL (IV SOLUTION) ×3 IMPLANT
PACK LAP CHOLECYSTECTOMY (MISCELLANEOUS) ×3 IMPLANT
PENCIL ELECTRO HAND CTR (MISCELLANEOUS) ×3 IMPLANT
POUCH SPECIMEN RETRIEVAL 10MM (ENDOMECHANICALS) ×3 IMPLANT
SCISSORS METZENBAUM CVD 33 (INSTRUMENTS) ×3 IMPLANT
SLEEVE ENDOPATH XCEL 5M (ENDOMECHANICALS) ×3 IMPLANT
SUT MNCRL AB 4-0 PS2 18 (SUTURE) ×3 IMPLANT
SUT VIC AB 0 CT1 36 (SUTURE) IMPLANT
SUT VICRYL 0 AB UR-6 (SUTURE) ×3 IMPLANT
TROCAR XCEL NON-BLD 11X100MML (ENDOMECHANICALS) ×3 IMPLANT
TROCAR XCEL NON-BLD 5MMX100MML (ENDOMECHANICALS) ×6 IMPLANT
TROCAR XCEL UNIV SLVE 11M 100M (ENDOMECHANICALS) ×3 IMPLANT
TUBING INSUFFLATION (TUBING) ×3 IMPLANT

## 2016-12-17 NOTE — Interval H&P Note (Signed)
History and Physical Interval Note:  12/17/2016 2:18 PM  [place]  has presented today for surgery, with the diagnosis of CALCULUS OF GALLBLADDER  The various methods of treatment have been discussed with the patient and family. After consideration of risks, benefits and other options for treatment, the patient has consented to  Procedure(s): LAPAROSCOPIC CHOLECYSTECTOMY (N/A) as a surgical intervention .  The patient's history has been reviewed, patient examined, no change in status, stable for surgery.  I have reviewed the patient's chart and labs.  Questions were answered to the patient's satisfaction.     Herbert Pun

## 2016-12-17 NOTE — Anesthesia Post-op Follow-up Note (Signed)
Anesthesia QCDR form completed.        

## 2016-12-17 NOTE — Anesthesia Preprocedure Evaluation (Signed)
Anesthesia Evaluation  Patient identified by MRN, date of birth, ID band Patient awake    Reviewed: Allergy & Precautions, NPO status , Patient's Chart, lab work & pertinent test results  Airway Mallampati: III  TM Distance: >3 FB     Dental  (+) Teeth Intact   Pulmonary sleep apnea and Continuous Positive Airway Pressure Ventilation ,    breath sounds clear to auscultation       Cardiovascular hypertension, Pt. on medications  Rhythm:Regular     Neuro/Psych Depression    GI/Hepatic Neg liver ROS, GERD  Medicated,  Endo/Other  Hypothyroidism Morbid obesity  Renal/GU negative Renal ROS     Musculoskeletal   Abdominal (+) + obese,   Peds negative pediatric ROS (+)  Hematology negative hematology ROS (+)   Anesthesia Other Findings   Reproductive/Obstetrics                             Anesthesia Physical Anesthesia Plan  ASA: III  Anesthesia Plan: General   Post-op Pain Management:    Induction: Intravenous  PONV Risk Score and Plan:   Airway Management Planned: Oral ETT  Additional Equipment:   Intra-op Plan:   Post-operative Plan: Extubation in OR  Informed Consent: I have reviewed the patients History and Physical, chart, labs and discussed the procedure including the risks, benefits and alternatives for the proposed anesthesia with the patient or authorized representative who has indicated his/her understanding and acceptance.     Plan Discussed with: CRNA  Anesthesia Plan Comments:         Anesthesia Quick Evaluation

## 2016-12-17 NOTE — Discharge Instructions (Addendum)
°  Diet: Resume home heart healthy regular diet.   Activity: No heavy lifting >20 pounds (children, pets, laundry, garbage) or strenuous activity until follow-up, but light activity and walking are encouraged. Do not drive or drink alcohol if taking narcotic pain medications.  Wound care: May shower with soapy water and pat dry (do not rub incisions) starting tomorrow, but no baths or submerging incision underwater until follow-up. (no swimming)   Medications: Resume all home medications. For mild to moderate pain: acetaminophen (Tylenol) or ibuprofen (if no kidney disease). Combining Tylenol with alcohol can substantially increase your risk of causing liver disease. Narcotic pain medications, if prescribed, can be used for severe pain, though may cause nausea, constipation, and drowsiness. Do not combine Tylenol and Percocet within a 6 hour period as Percocet contains Tylenol. If you do not need the narcotic pain medication, you do not need to fill the prescription.  Call office [phone]) at any time if any questions, worsening pain, fevers/chills, bleeding, drainage from incision site, or other concerns.     AMBULATORY SURGERY  DISCHARGE INSTRUCTIONS   1) The drugs that you were given will stay in your system until tomorrow so for the next 24 hours you should not:  A) Drive an automobile B) Make any legal decisions C) Drink any alcoholic beverage   2) You may resume regular meals tomorrow.  Today it is better to start with liquids and gradually work up to solid foods.  You may eat anything you prefer, but it is better to start with liquids, then soup and crackers, and gradually work up to solid foods.   3) Please notify your doctor immediately if you have any unusual bleeding, trouble breathing, redness and pain at the surgery site, drainage, fever, or pain not relieved by medication.    4) Additional Instructions:        Please contact your physician with any problems  or Same Day Surgery at [phone], Monday through Friday 6 am to 4 pm, or [place] at [facility] number at [phone].

## 2016-12-17 NOTE — Transfer of Care (Signed)
Immediate Anesthesia Transfer of Care Note  Patient: Louis Thomas  Procedure(s) Performed: LAPAROSCOPIC CHOLECYSTECTOMY (N/A Abdomen)  Patient Location: PACU  Anesthesia Type:General  Level of Consciousness: awake, alert  and oriented  Airway & Oxygen Therapy: Patient Spontanous Breathing and Patient connected to face mask oxygen  Post-op Assessment: Report given to RN and Post -op Vital signs reviewed and stable  Post vital signs: Reviewed and stable  Last Vitals:  Vitals:   12/17/16 1329  BP: 133/83  Pulse: 84  Resp: 17  Temp: 36.8 C  SpO2: 99%    Last Pain:  Vitals:   12/17/16 1329  TempSrc: Oral  PainSc: 3          Complications: No apparent anesthesia complications

## 2016-12-17 NOTE — Anesthesia Procedure Notes (Signed)
Procedure Name: Intubation Date/Time: 12/17/2016 2:50 PM Performed by: Zetta Bills, CRNA Pre-anesthesia Checklist: Patient identified, Patient being monitored, Timeout performed, Emergency Drugs available and Suction available Patient Re-evaluated:Patient Re-evaluated prior to induction Oxygen Delivery Method: Circle system utilized Preoxygenation: Pre-oxygenation with 100% oxygen Induction Type: IV induction Ventilation: Two handed mask ventilation required and Oral airway inserted - appropriate to patient size Laryngoscope Size: 3 and McGraph Grade View: Grade I Tube type: Oral Tube size: 7.0 mm Number of attempts: 1 Airway Equipment and Method: Stylet and Video-laryngoscopy Placement Confirmation: ETT inserted through vocal cords under direct vision,  positive ETCO2 and breath sounds checked- equal and bilateral Secured at: 22 cm Tube secured with: Tape Dental Injury: Teeth and Oropharynx as per pre-operative assessment

## 2016-12-17 NOTE — Op Note (Signed)
Preoperative diagnosis: Symptomatic cholelithiasis.  Postoperative diagnosis: Symptomatic cholelithiasis.  Procedure: Laparoscopic Cholecystectomy.   Anesthesia: GETA   Surgeon: Dr. Windell Moment  Wound Classification: Clean Contaminated  Indications: Patient is a 56 y.o. male developed right upper quadrant pain and nausea nausea associated with food intake and on workup was found to have cholelithiasis with a normal common duct and no sign of cholecystitis. Laparoscopic cholecystectomy was elected.  Findings: Critical view of safety achieved Cystic duct and artery identified, ligated and divided Adequate hemostasis Abundant amount of gallstones that needed to be removed to be able to take out the gallbladder through the umbilical incision.   Description of procedure: The patient was placed on the operating table in the supine position. General anesthesia was induced. A time-out was completed verifying correct patient, procedure, site, positioning, and implant(s) and/or special equipment prior to beginning this procedure. An orogastric tube was placed. The abdomen was prepped and draped in the usual sterile fashion.  An incision was made in a natural skin line below the umbilicus.  The fascia was elevated and the Veress needle inserted. Proper position was confirmed by aspiration and saline meniscus test.  The abdomen was insufflated with carbon dioxide to a pressure of 15 mmHg. The patient tolerated insufflation well. A 11-mm trocar was then inserted.  The laparoscope was inserted and the abdomen inspected. No injuries from initial trocar placement were noted. Additional trocars were then inserted in the following locations: a 5-mm trocar in the right epigastrium and two 5-mm trocars along the right costal margin. The abdomen was inspected and no abnormalities were found. The table was placed in the reverse Trendelenburg position with the right side up.  Filmy adhesions between the  gallbladder and omentum, duodenum and transverse colon were lysed sharply. The dome of the gallbladder was grasped with an atraumatic grasper passed through the lateral port and retracted over the dome of the liver. The infundibulum was also grasped with an atraumatic grasper through the midclavicular port and retracted toward the right lower quadrant. This maneuver exposed Calot's triangle. The peritoneum overlying the gallbladder infundibulum was then incised and the cystic duct and cystic artery identified and circumferentially dissected. The cystic duct and cystic artery were then doubly clipped and divided close to the gallbladder.  The gallbladder was then dissected from its peritoneal attachments by electrocautery. Hemostasis was checked and the gallbladder and contained stones were removed using an endoscopic retrieval bag placed through the umbilical port. The gallbladder was passed off the table as a specimen. The gallbladder fossa was copiously irrigated with saline and hemostasis was obtained. There was no evidence of bleeding from the gallbladder fossa or cystic artery or leakage of the bile from the cystic duct stump. Secondary trocars were removed under direct vision. No bleeding was noted. The laparoscope was withdrawn and the umbilical trocar removed. The abdomen was allowed to collapse. The fascia of the 54mm trocar sites was closed with figure-of-eight 0 vicryl sutures. The skin was closed with subcuticular sutures of 4-0 monocryl and topical skin adhesive. The orogastric tube was removed.  The patient tolerated the procedure well and was taken to the postanesthesia care unit in stable condition.   Specimen: Gallbladder  Complications: None  EBL: 25 mL

## 2016-12-19 LAB — SURGICAL PATHOLOGY

## 2016-12-26 NOTE — Anesthesia Postprocedure Evaluation (Signed)
Anesthesia Post Note  Patient: Louis Thomas  Procedure(s) Performed: LAPAROSCOPIC CHOLECYSTECTOMY (N/A Abdomen)  Patient location during evaluation: PACU Anesthesia Type: General Level of consciousness: awake and alert Pain management: pain level controlled Vital Signs Assessment: post-procedure vital signs reviewed and stable Respiratory status: spontaneous breathing, nonlabored ventilation, respiratory function stable and patient connected to nasal cannula oxygen Cardiovascular status: blood pressure returned to baseline and stable Postop Assessment: no apparent nausea or vomiting Anesthetic complications: no     Last Vitals:  Vitals:   12/17/16 1804 12/17/16 1813  BP: 133/79 138/83  Pulse: (!) 101   Resp: 20   Temp:    SpO2: 97%     Last Pain:  Vitals:   12/18/16 0839  TempSrc:   PainSc: 6                  Molli Barrows

## 2017-01-29 DIAGNOSIS — L02439 Carbuncle of limb, unspecified: Secondary | ICD-10-CM | POA: Insufficient documentation

## 2017-04-30 DIAGNOSIS — L819 Disorder of pigmentation, unspecified: Secondary | ICD-10-CM | POA: Insufficient documentation

## 2017-04-30 DIAGNOSIS — M5416 Radiculopathy, lumbar region: Secondary | ICD-10-CM | POA: Insufficient documentation

## 2018-05-09 IMAGING — MR MR LUMBAR SPINE W/O CM
5 series · 35 of 48 positions shown · non-contrast
Comparison: 03/06/2016

CLINICAL DATA: Status post lumbar discectomy. Bilateral leg pain
with numbness and tingling since the surgery.

EXAM:
MRI LUMBAR SPINE WITHOUT CONTRAST
TECHNIQUE: Multiplanar, multisequence MR imaging of the lumbar spine was
performed. No intravenous contrast was administered.

[Series 2: T2 · sagittal · 4.0mm · 0.81mm/px · 6 of 15 slices shown (1 of 2)]
[im 1/15]
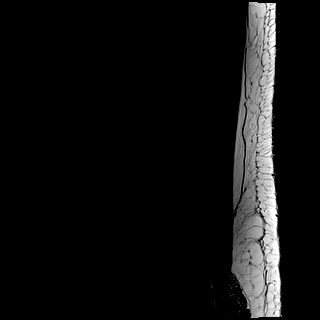
[im 3/15]
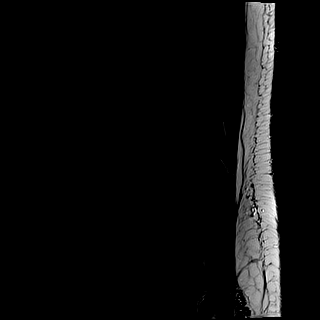
[im 6/15]
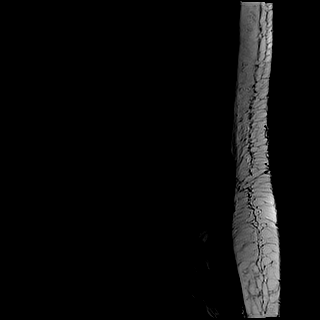
[im 9/15]
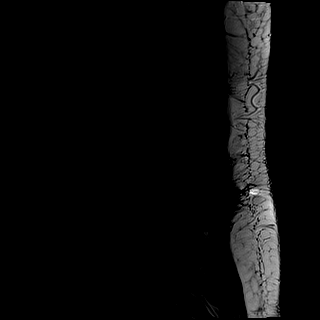
[im 12/15]
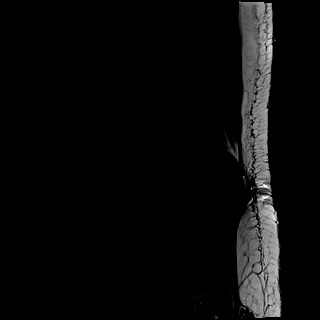
[im 15/15]
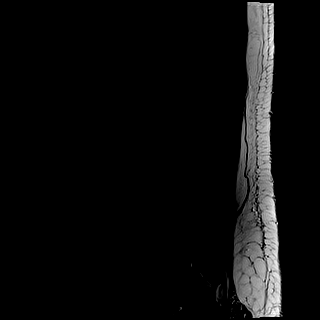

[Series 3: T1 · sagittal · 4.0mm · 0.81mm/px · 5 of 15 slices shown (1 of 2)]
[im 1/15]
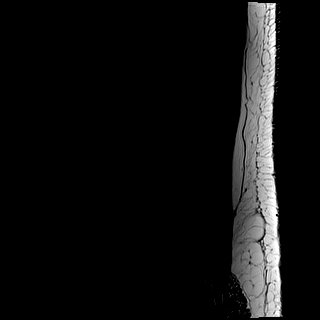
[im 4/15]
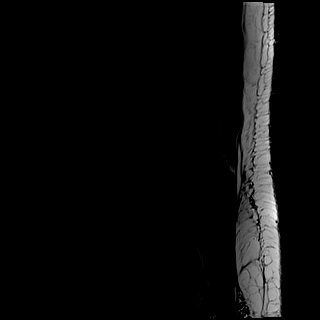
[im 8/15]
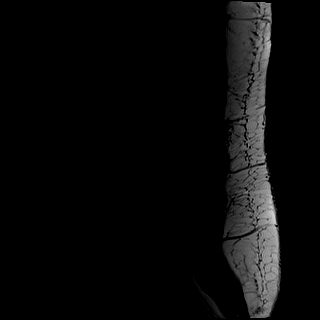
[im 11/15]
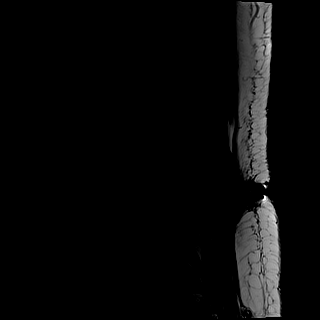
[im 15/15]
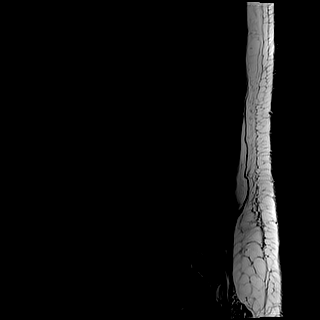

[Series 4: STIR · sagittal · 4.0mm · 1.02mm/px · 4 of 15 slices shown]
[im 1/15]
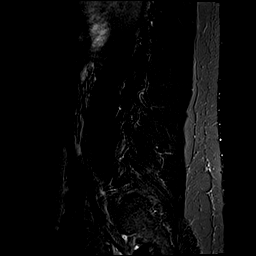
[im 4/15]
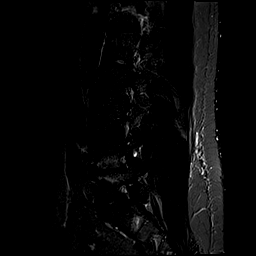
[im 8/15]
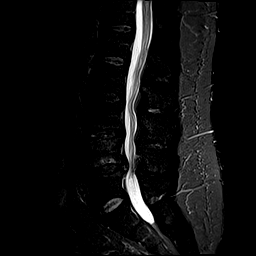
[im 11/15]
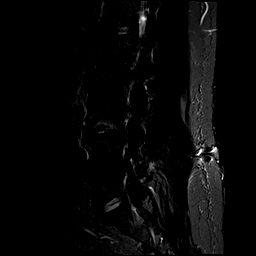

[Series 5: T2 · axial · 4.0mm · 0.78mm/px · z∈[+11,+240]mm · 10 of 45 slices shown (2 of 2)]
[im 3/45]
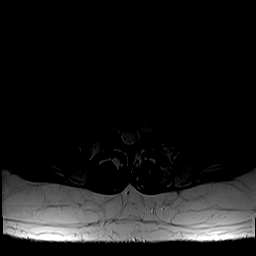
[im 6/45]
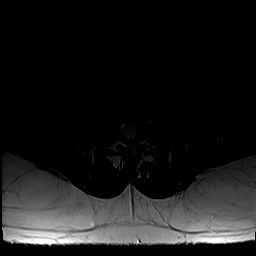
[im 9/45]
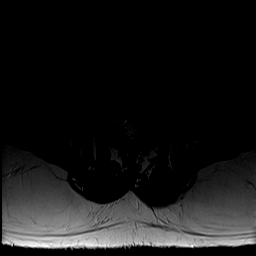
[im 15/45]
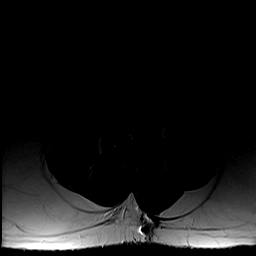
[im 21/45]
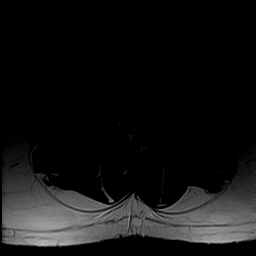
[im 24/45]
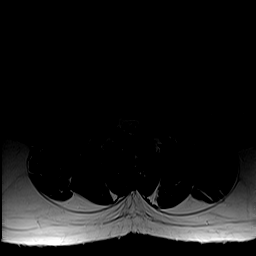
[im 27/45]
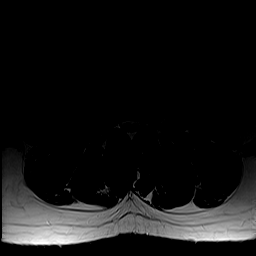
[im 33/45]
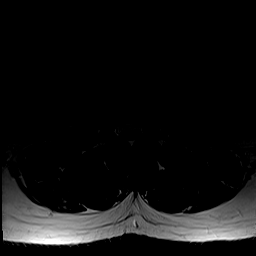
[im 39/45]
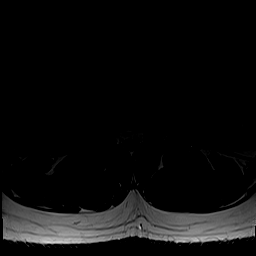
[im 45/45]
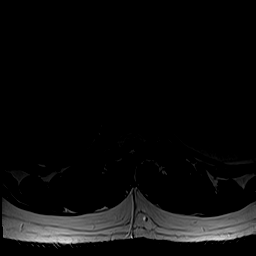

[Series 6: T1 · axial · 4.0mm · 0.39mm/px · z∈[+11,+240]mm · 10 of 45 slices shown (2 of 2)]
[im 3/45]
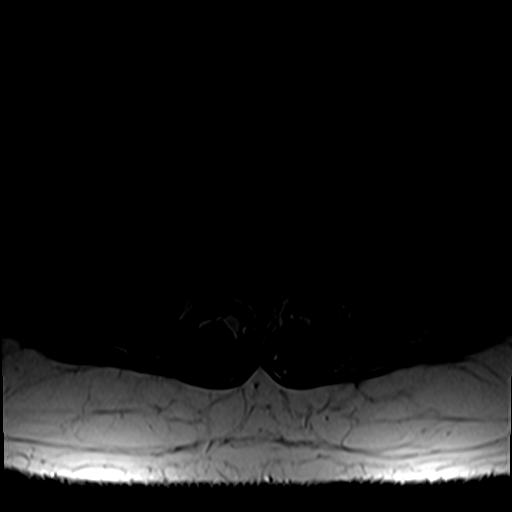
[im 6/45]
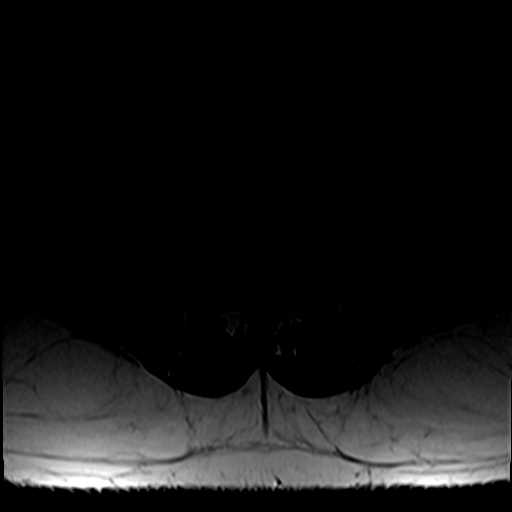
[im 9/45]
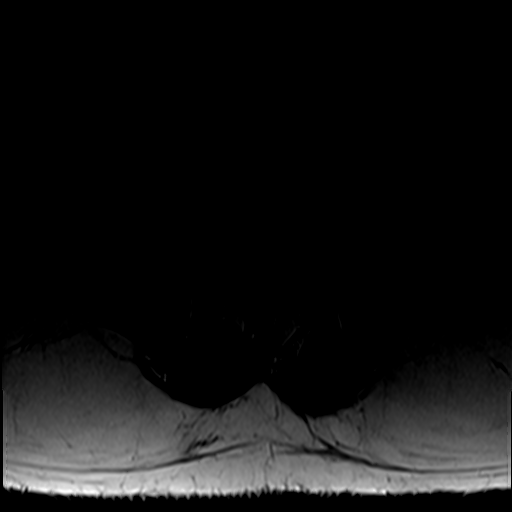
[im 15/45]
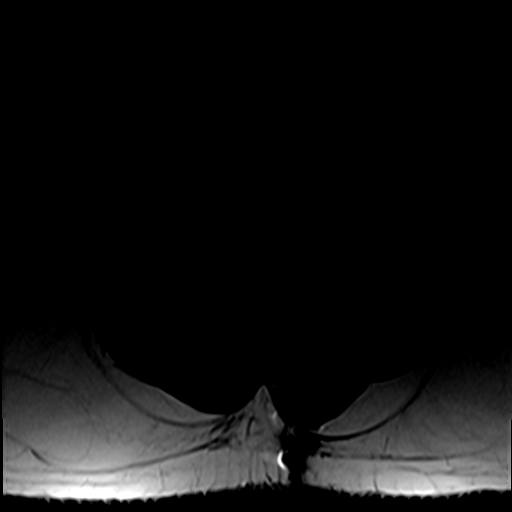
[im 21/45]
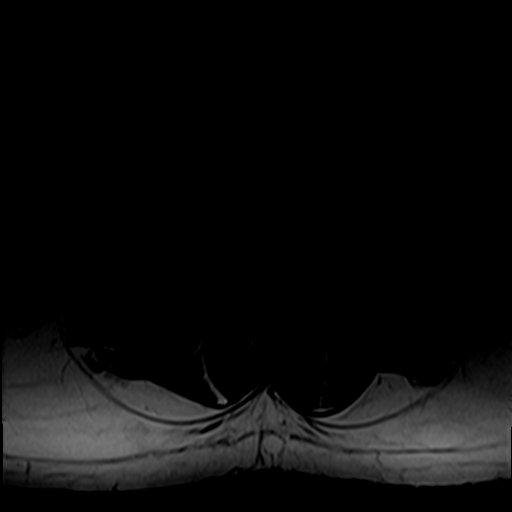
[im 24/45]
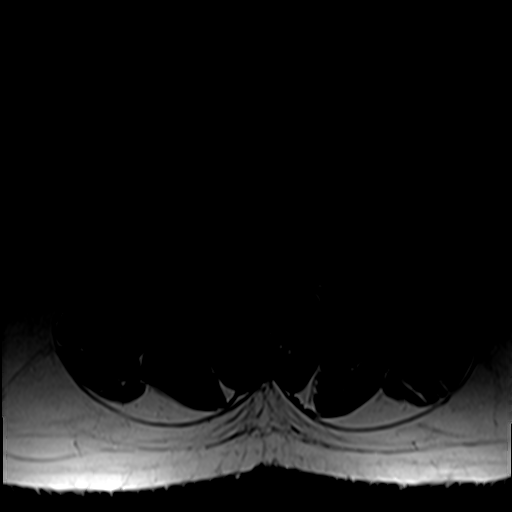
[im 27/45]
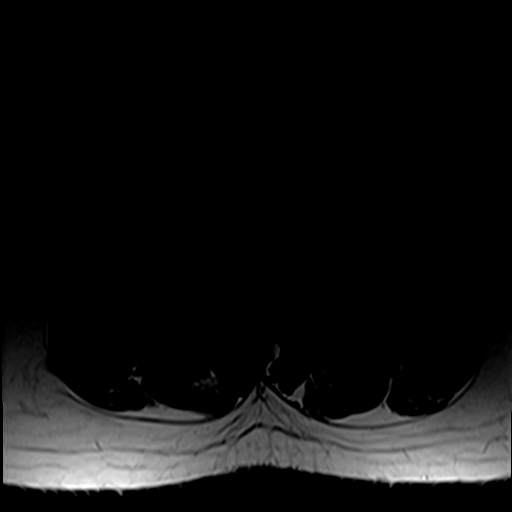
[im 33/45]
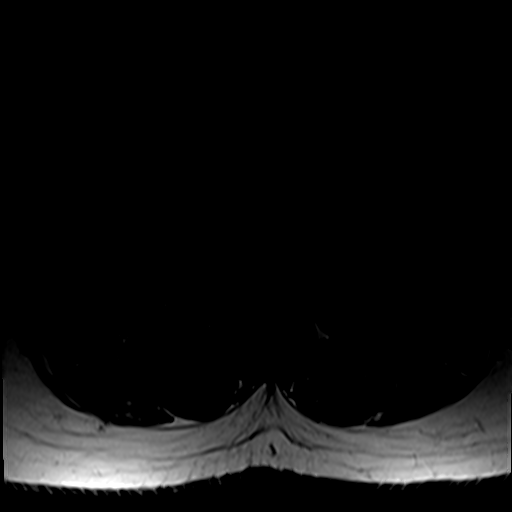
[im 39/45]
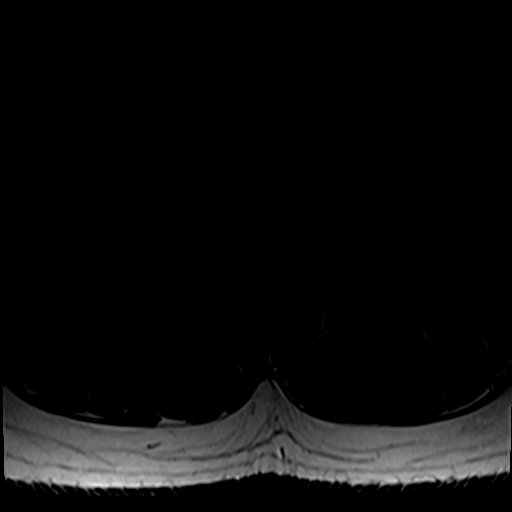
[im 45/45]
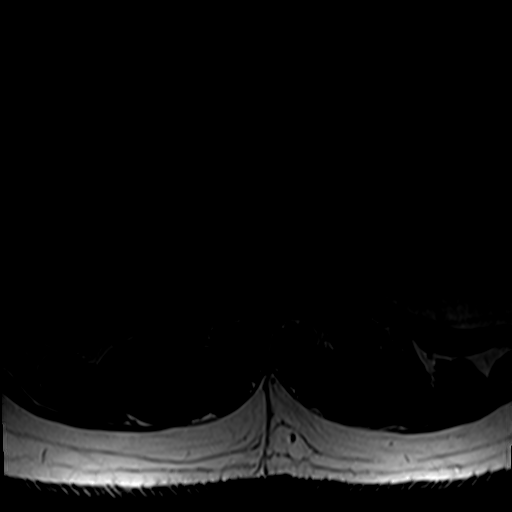

[35 of 48 positions shown; findings below may reference images not displayed]

FINDINGS: Segmentation:  Standard.

Alignment:  Physiologic.

Vertebrae: Edematous signal around L3-4 Schmorl's nodes. Stable
edematous signal about the right aspect of the degenerated L4-5
interspace. Remote L1 superior endplate fracture. No acute fracture,
discitis, or aggressive bone lesion.

Conus medullaris: Extends to the L1 level and appears normal.

Paraspinal and other soft tissues: Expected postoperative changes in
the posterior soft tissues.

Disc levels:

T12- L1: Unremarkable.

L1-L2: Unremarkable.

L2-L3: Chronic right extraforaminal and foraminal disc protrusion
contacting the extraforaminal right L2 nerve root. Patent spinal
canal

L3-L4: Mild disc narrowing and annulus bulging. Chronic endplate
degeneration with large L4 superior endplate Schmorl's node. No
impingement or change.

L4-L5: Disc narrowing with central protrusion that is smaller than
before. Less mass effect on the thecal sac but still high-grade
spinal stenosis. There is changes of left-sided laminotomy. Disc
narrowing and bulging greater towards the right. No compressive
foraminal stenosis.

L5-S1:Mild facet spurring.  No impingement.
IMPRESSION: 1. L4-5 recurrent central disc protrusion with high-grade spinal
stenosis.
2. L2-3 right foraminal and extraforaminal protrusion with L2
contact, stable.
3. Degenerative edematous signal about the L3-4 and L4-5 disc
spaces, stable.

## 2019-09-08 ENCOUNTER — Ambulatory Visit (INDEPENDENT_AMBULATORY_CARE_PROVIDER_SITE_OTHER): Payer: BC Managed Care – PPO | Admitting: Urology

## 2019-09-08 ENCOUNTER — Other Ambulatory Visit: Payer: Self-pay

## 2019-09-08 ENCOUNTER — Other Ambulatory Visit
Admission: RE | Admit: 2019-09-08 | Discharge: 2019-09-08 | Disposition: A | Discharge status: Home / Self Care | Payer: BC Managed Care – PPO | Source: Ambulatory Visit | Attending: Urology | Admitting: Urology

## 2019-09-08 ENCOUNTER — Telehealth: Payer: Self-pay

## 2019-09-08 VITALS — BP 143/90 | HR 84 | Ht 72.0 in | Wt 262.6 lb

## 2019-09-08 DIAGNOSIS — Z125 Encounter for screening for malignant neoplasm of prostate: Secondary | ICD-10-CM | POA: Diagnosis not present

## 2019-09-08 DIAGNOSIS — R3915 Urgency of urination: Secondary | ICD-10-CM | POA: Insufficient documentation

## 2019-09-08 DIAGNOSIS — R350 Frequency of micturition: Secondary | ICD-10-CM

## 2019-09-08 DIAGNOSIS — R310 Gross hematuria: Secondary | ICD-10-CM | POA: Insufficient documentation

## 2019-09-08 LAB — URINALYSIS, COMPLETE (UACMP) WITH MICROSCOPIC
Bilirubin Urine: NEGATIVE
Glucose, UA: NEGATIVE mg/dL
Ketones, ur: NEGATIVE mg/dL
Leukocytes,Ua: NEGATIVE
Nitrite: NEGATIVE
Protein, ur: NEGATIVE mg/dL
Specific Gravity, Urine: 1.025 (ref 1.005–1.030)
Squamous Epithelial / HPF: NONE SEEN (ref 0–5)
pH: 5.5 (ref 5.0–8.0)

## 2019-09-08 LAB — BLADDER SCAN AMB NON-IMAGING

## 2019-09-08 NOTE — Telephone Encounter (Signed)
Called pt, no answer. 1st attempt.  

## 2019-09-08 NOTE — Telephone Encounter (Signed)
-----   Message from Billey Co, MD sent at 09/08/2019  1:07 PM EDT ----- No infection on urinalysis, but persistent blood, recommend CT and cystoscopy as discussed in clinic.  Nickolas Madrid, MD 09/08/2019

## 2019-09-08 NOTE — Progress Notes (Signed)
09/08/19 12:47 PM   [place] 1960-08-25 [id number]  CC: Urinary symptoms, gross hematuria  HPI: I saw Mr. Stthomas in urology clinic for the above issues.  He is a 59 year old relatively healthy male who reports worsening urinary symptoms over the last few months including urinary urgency and frequency as well as some spraying stream.  He has never tried any medications for patient before.  He also had one 24-hour period of gross hematuria associated with some mild right groin pain.  He has a [place] history of microscopic hematuria with 4-10 RBCs on urinalysis over the last 2 years.  There is no recent cross-sectional imaging to review.  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  Last PSA was normal in July 2020 at 2.22.  He is a never smoker and denies any other carcinogenic exposures.  IPSS score today is 9, with quality of life mostly dissatisfied.  PVR is normal at 0 mL.  Urinalysis benign aside from microscopic hematuria with 20-50 RBCs.   PMH: Past Medical History:  Diagnosis Date  . Arthritis    shoulders, lower back, knees  . Depression   . GERD (gastroesophageal reflux disease)   . History of kidney stones   . Hyperlipidemia   . Hypertension   . Hypothyroid   . Sleep apnea   . Spinal stenosis    lower back    Surgical History: Past Surgical History:  Procedure Laterality Date  . CHOLECYSTECTOMY N/A 12/17/2016   Procedure: LAPAROSCOPIC CHOLECYSTECTOMY;  Surgeon: Herbert Pun, MD;  Location: [facility];  Service: General;  Laterality: N/A;  . COLONOSCOPY N/A 05/10/2015   Procedure: COLONOSCOPY;  Surgeon: Hulen Luster, MD;  Location: [facility];  Service: Gastroenterology;  Laterality: N/A;  . KNEE ARTHROSCOPY Left 1999  . LUMBAR LAMINECTOMY/DECOMPRESSION MICRODISCECTOMY Left 04/02/2016   Procedure:  LEFT L4-5 MIS MICRODISCECTOMY;  Surgeon: Bayard Hugger, MD;  Location: [facility];  Service: Neurosurgery;  Laterality: Left;  . THYROID SURGERY     multinodular goiter removal  . THYROIDECTOMY  11/2012   total    Family History: No family history on file.  Social History:  reports that he has never smoked. He has never used smokeless tobacco. He reports that he does not drink alcohol and does not use drugs.  Physical Exam: BP (!) 143/90 (BP Location: Left Arm, Patient Position: Sitting, Cuff Size: Large)   Pulse 84   Ht 6' (1.829 m)   Wt 262 lb 9.6 oz (119.1 kg)   BMI 35.61 kg/m    Constitutional:  Alert and oriented, No acute distress. Cardiovascular: No clubbing, cyanosis, or edema. Respiratory: Normal respiratory effort, no increased work of breathing. GI: Abdomen is soft, nontender, nondistended, no abdominal masses GU: Phallus with patent meatus, no lesions, testicles 20 cc and descended bilaterally DRE: 40 g, benign, limited by body habitus  Laboratory Data: Reviewed, see HPI  Pertinent Imaging: None to review  Assessment & Plan:   In summary, he is a 59 year old male with 1 episode of gross hematuria, vague intermittent right-sided abdominal pain intermittently that has improved over the last few weeks, and persistent microscopic hematuria with 20-50 RBCs today, as well as some mild to moderate urinary symptoms of weak stream and urinary frequency.  We discussed common possible etiologies of hematuria including BPH, malignancy, urolithiasis, medical renal  disease, and idiopathic. Standard workup recommended by the AUA includes imaging with CT urogram to assess the upper tracts, and cystoscopy. Cytology is performed on patient's with gross hematuria to look for malignant cells in the urine.  RTC for CT urogram and cystoscopy to complete hematuria work-up Continue yearly PSA screening  Nickolas Madrid, MD 09/08/2019  [place] [address]  [place], [place] [address] [phone]

## 2019-09-08 NOTE — Patient Instructions (Addendum)
Prostate Cancer Screening  Prostate cancer screening is a test that is done to check for the presence of prostate cancer in men. The prostate gland is a walnut-sized gland that is located below the bladder and in front of the rectum in males. The function of the prostate is to add fluid to semen during ejaculation. Prostate cancer is the second most common type of cancer in men. Who should have prostate cancer screening?  Screening recommendations vary based on age and other risk factors. Screening is recommended if:  You are older than age 55. If you are age 55-69, talk with your health care provider about your need for screening and how often screening should be done. Because most prostate cancers are slow growing and will not cause death, screening is generally reserved in this age group for men who have a 10-15-year life expectancy.  You are younger than age 55, and you have these risk factors: ? Being a black male or a male of African descent. ? Having a father, brother, or uncle who has been diagnosed with prostate cancer. The risk is higher if your family member's cancer occurred at an early age. Screening is not recommended if:  You are younger than age 40.  You are between the ages of 40 and 54 and you have no risk factors.  You are 60 years of age or older. At this age, the risks that screening can cause are greater than the benefits that it may provide. If you are at high risk for prostate cancer, your health care provider may recommend that you have screenings more often or that you start screening at a younger age. How is screening for prostate cancer done? The recommended prostate cancer screening test is a blood test called the prostate-specific antigen (PSA) test. PSA is a protein that is made in the prostate. As you age, your prostate naturally produces more PSA. Abnormally high PSA levels may be caused by:  Prostate cancer.  An enlarged prostate that is not caused by cancer  (benign prostatic hyperplasia, BPH). This condition is very common in older men.  A prostate gland infection (prostatitis). Depending on the PSA results, you may need more tests, such as:  A physical exam to check the size of your prostate gland.  Blood and imaging tests.  A procedure to remove tissue samples from your prostate gland for testing (biopsy). What are the benefits of prostate cancer screening?  Screening can help to identify cancer at an early stage, before symptoms start and when the cancer can be treated more easily.  There is a small chance that screening may lower your risk of dying from prostate cancer. The chance is small because prostate cancer is a slow-growing cancer, and most men with prostate cancer die from a different cause. What are the risks of prostate cancer screening? The main risk of prostate cancer screening is diagnosing and treating prostate cancer that would never have caused any symptoms or problems. This is called overdiagnosisand overtreatment. PSA screening cannot tell you if your PSA is high due to cancer or a different cause. A prostate biopsy is the only procedure to diagnose prostate cancer. Even the results of a biopsy may not tell you if your cancer needs to be treated. Slow-growing prostate cancer may not need any treatment other than monitoring, so diagnosing and treating it may cause unnecessary stress or other side effects. A prostate biopsy may also cause:  Infection or fever.  A false negative. This is   a result that shows that you do not have prostate cancer when you actually do have prostate cancer. Questions to ask your health care provider  When should I start prostate cancer screening?  What is my risk for prostate cancer?  How often do I need screening?  What type of screening tests do I need?  How do I get my test results?  What do my results mean?  Do I need treatment? Where to find more information  The American Cancer  Society: www.cancer.org  American Urological Association: www.auanet.org Contact a health care provider if:  You have difficulty urinating.  You have pain when you urinate or ejaculate.  You have blood in your urine or semen.  You have pain in your back or in the area of your prostate. Summary  Prostate cancer is a common type of cancer in men. The prostate gland is located below the bladder and in front of the rectum. This gland adds fluid to semen during ejaculation.  Prostate cancer screening may identify cancer at an early stage, when the cancer can be treated more easily.  The prostate-specific antigen (PSA) test is the recommended screening test for prostate cancer.  Discuss the risks and benefits of prostate cancer screening with your health care provider. If you are age 60 or older, the risks that screening can cause are greater than the benefits that it may provide. This information is not intended to replace advice given to you by your health care provider. Make sure you discuss any questions you have with your health care provider. Document Revised: 07/31/2018 Document Reviewed: 07/31/2018 Elsevier Patient Education  [place].    Cystoscopy Cystoscopy is a procedure that is used to help diagnose and sometimes treat conditions that affect the lower urinary tract. The lower urinary tract includes the bladder and the urethra. The urethra is the tube that drains urine from the bladder. Cystoscopy is done using a thin, tube-shaped instrument with a light and camera at the end (cystoscope). The cystoscope may be hard or flexible, depending on the goal of the procedure. The cystoscope is inserted through the urethra, into the bladder. Cystoscopy may be recommended if you have:  Urinary tract infections that keep coming back.  Blood in the urine (hematuria).  An inability to control when you urinate (urinary incontinence) or an overactive bladder.  Unusual cells  found in a urine sample.  A blockage in the urethra, such as a urinary stone.  Painful urination.  An abnormality in the bladder found during an intravenous pyelogram (IVP) or CT scan. Cystoscopy may also be done to remove a sample of tissue to be examined under a microscope (biopsy). Tell a health care provider about:  Any allergies you have.  All medicines you are taking, including vitamins, herbs, eye drops, creams, and over-the-counter medicines.  Any problems you or family members have had with anesthetic medicines.  Any blood disorders you have.  Any surgeries you have had.  Any medical conditions you have.  Whether you are pregnant or may be pregnant. What are the risks? Generally, this is a safe procedure. However, problems may occur, including:  Infection.  Bleeding.  Allergic reactions to medicines.  Damage to other structures or organs. What happens before the procedure?  Ask your health care provider about: ? Changing or stopping your regular medicines. This is especially important if you are taking diabetes medicines or blood thinners. ? Taking medicines such as aspirin and ibuprofen. These medicines can thin your  blood. Do not take these medicines unless your health care provider tells you to take them. ? Taking over-the-counter medicines, vitamins, herbs, and supplements.  Follow instructions from your health care provider about eating or drinking restrictions.  Ask your health care provider what steps will be taken to help prevent infection. These may include: ? Washing skin with a germ-killing soap. ? Taking antibiotic medicine.  You may have an exam or testing, such as: ? X-rays of the bladder, urethra, or kidneys. ? Urine tests to check for signs of infection.  Plan to have someone take you home from the hospital or clinic. What happens during the procedure?   You will be given one or more of the following: ? A medicine to help you relax  (sedative). ? A medicine to numb the area (local anesthetic).  The area around the opening of your urethra will be cleaned.  The cystoscope will be passed through your urethra into your bladder.  Germ-free (sterile) fluid will flow through the cystoscope to fill your bladder. The fluid will stretch your bladder so that your health care provider can clearly examine your bladder walls.  Your doctor will look at the urethra and bladder. Your doctor may take a biopsy or remove stones.  The cystoscope will be removed, and your bladder will be emptied. The procedure may vary among health care providers and hospitals. What can I expect after the procedure? After the procedure, it is common to have:  Some soreness or pain in your abdomen and urethra.  Urinary symptoms. These include: ? Mild pain or burning when you urinate. Pain should stop within a few minutes after you urinate. This may last for up to 1 week. ? A small amount of blood in your urine for several days. ? Feeling like you need to urinate but producing only a small amount of urine. Follow these instructions at home: Medicines  Take over-the-counter and prescription medicines only as told by your health care provider.  If you were prescribed an antibiotic medicine, take it as told by your health care provider. Do not stop taking the antibiotic even if you start to feel better. General instructions  Return to your normal activities as told by your health care provider. Ask your health care provider what activities are safe for you.  Watch for any blood in your urine. If the amount of blood in your urine increases, call your health care provider.  Follow instructions from your health care provider about eating or drinking restrictions.  If a tissue sample was removed for testing (biopsy) during your procedure, it is up to you to get your test results. Ask your health care provider, or the department that is doing the test, when  your results will be ready.  Drink enough fluid to keep your urine pale yellow.  Keep all follow-up visits as told by your health care provider. This is important. Contact a health care provider if you:  Have pain that gets worse or does not get better with medicine, especially pain when you urinate.  Have trouble urinating.  Have more blood in your urine. Get help right away if you:  Have blood clots in your urine.  Have abdominal pain.  Have a fever or chills.  Are unable to urinate. Summary  Cystoscopy is a procedure that is used to help diagnose and sometimes treat conditions that affect the lower urinary tract.  Cystoscopy is done using a thin, tube-shaped instrument with a light and camera at  the end.  After the procedure, it is common to have some soreness or pain in your abdomen and urethra.  Watch for any blood in your urine. If the amount of blood in your urine increases, call your health care provider.  If you were prescribed an antibiotic medicine, take it as told by your health care provider. Do not stop taking the antibiotic even if you start to feel better. This information is not intended to replace advice given to you by your health care provider. Make sure you discuss any questions you have with your health care provider. Document Revised: 12/10/2017 Document Reviewed: 12/10/2017 Elsevier Patient Education  [employer].

## 2019-09-09 LAB — URINE CULTURE: Culture: NO GROWTH

## 2019-09-09 NOTE — Telephone Encounter (Signed)
Called pt informed him of the information below. Pt gave verbal understanding.  

## 2019-09-22 ENCOUNTER — Telehealth: Payer: Self-pay | Admitting: Urology

## 2019-09-22 NOTE — Telephone Encounter (Signed)
pt cx'd his Cysto appt with for next week and is going to cancel his CT as well, pt states his symptoms have improved and doesn't feel he needs to keep appts. wants to see what his urine looks like at his physical the first of the month next month - FYI

## 2019-09-25 ENCOUNTER — Ambulatory Visit: Payer: Medicare Other

## 2019-09-29 ENCOUNTER — Other Ambulatory Visit: Payer: Self-pay | Admitting: Urology

## 2020-01-08 DIAGNOSIS — D216 Benign neoplasm of connective and other soft tissue of trunk, unspecified: Secondary | ICD-10-CM | POA: Diagnosis not present

## 2020-02-09 DIAGNOSIS — E890 Postprocedural hypothyroidism: Secondary | ICD-10-CM | POA: Diagnosis not present

## 2020-02-09 DIAGNOSIS — I10 Essential (primary) hypertension: Secondary | ICD-10-CM | POA: Diagnosis not present

## 2020-02-09 DIAGNOSIS — R7303 Prediabetes: Secondary | ICD-10-CM | POA: Diagnosis not present

## 2020-02-16 DIAGNOSIS — Z0000 Encounter for general adult medical examination without abnormal findings: Secondary | ICD-10-CM | POA: Diagnosis not present

## 2020-02-16 DIAGNOSIS — E782 Mixed hyperlipidemia: Secondary | ICD-10-CM | POA: Diagnosis not present

## 2020-02-16 DIAGNOSIS — E890 Postprocedural hypothyroidism: Secondary | ICD-10-CM | POA: Diagnosis not present

## 2020-02-16 DIAGNOSIS — R7303 Prediabetes: Secondary | ICD-10-CM | POA: Diagnosis not present

## 2020-02-16 DIAGNOSIS — E559 Vitamin D deficiency, unspecified: Secondary | ICD-10-CM | POA: Diagnosis not present

## 2020-02-16 DIAGNOSIS — Z125 Encounter for screening for malignant neoplasm of prostate: Secondary | ICD-10-CM | POA: Diagnosis not present

## 2020-02-16 DIAGNOSIS — G8929 Other chronic pain: Secondary | ICD-10-CM | POA: Diagnosis not present

## 2020-02-16 DIAGNOSIS — I10 Essential (primary) hypertension: Secondary | ICD-10-CM | POA: Diagnosis not present

## 2020-02-16 DIAGNOSIS — M25562 Pain in left knee: Secondary | ICD-10-CM | POA: Diagnosis not present

## 2020-03-03 DIAGNOSIS — F411 Generalized anxiety disorder: Secondary | ICD-10-CM | POA: Diagnosis not present

## 2020-03-03 DIAGNOSIS — F333 Major depressive disorder, recurrent, severe with psychotic symptoms: Secondary | ICD-10-CM | POA: Diagnosis not present

## 2020-05-19 DIAGNOSIS — F333 Major depressive disorder, recurrent, severe with psychotic symptoms: Secondary | ICD-10-CM | POA: Diagnosis not present

## 2020-05-19 DIAGNOSIS — F411 Generalized anxiety disorder: Secondary | ICD-10-CM | POA: Diagnosis not present

## 2020-06-30 DIAGNOSIS — F411 Generalized anxiety disorder: Secondary | ICD-10-CM | POA: Diagnosis not present

## 2020-06-30 DIAGNOSIS — F333 Major depressive disorder, recurrent, severe with psychotic symptoms: Secondary | ICD-10-CM | POA: Diagnosis not present

## 2020-08-04 DIAGNOSIS — F333 Major depressive disorder, recurrent, severe with psychotic symptoms: Secondary | ICD-10-CM | POA: Diagnosis not present

## 2020-08-04 DIAGNOSIS — F411 Generalized anxiety disorder: Secondary | ICD-10-CM | POA: Diagnosis not present

## 2020-08-08 DIAGNOSIS — E890 Postprocedural hypothyroidism: Secondary | ICD-10-CM | POA: Diagnosis not present

## 2020-08-08 DIAGNOSIS — I10 Essential (primary) hypertension: Secondary | ICD-10-CM | POA: Diagnosis not present

## 2020-08-08 DIAGNOSIS — R7303 Prediabetes: Secondary | ICD-10-CM | POA: Diagnosis not present

## 2020-08-08 DIAGNOSIS — Z125 Encounter for screening for malignant neoplasm of prostate: Secondary | ICD-10-CM | POA: Diagnosis not present

## 2020-08-15 DIAGNOSIS — E782 Mixed hyperlipidemia: Secondary | ICD-10-CM | POA: Diagnosis not present

## 2020-08-15 DIAGNOSIS — R7303 Prediabetes: Secondary | ICD-10-CM | POA: Diagnosis not present

## 2020-08-15 DIAGNOSIS — I10 Essential (primary) hypertension: Secondary | ICD-10-CM | POA: Diagnosis not present

## 2020-08-15 DIAGNOSIS — E890 Postprocedural hypothyroidism: Secondary | ICD-10-CM | POA: Diagnosis not present

## 2020-08-15 DIAGNOSIS — K219 Gastro-esophageal reflux disease without esophagitis: Secondary | ICD-10-CM | POA: Diagnosis not present

## 2020-11-23 DIAGNOSIS — M25562 Pain in left knee: Secondary | ICD-10-CM | POA: Diagnosis not present

## 2020-11-23 DIAGNOSIS — M1712 Unilateral primary osteoarthritis, left knee: Secondary | ICD-10-CM | POA: Diagnosis not present

## 2021-02-02 DIAGNOSIS — L218 Other seborrheic dermatitis: Secondary | ICD-10-CM | POA: Diagnosis not present

## 2021-02-13 DIAGNOSIS — R7303 Prediabetes: Secondary | ICD-10-CM | POA: Diagnosis not present

## 2021-02-20 DIAGNOSIS — Z1389 Encounter for screening for other disorder: Secondary | ICD-10-CM | POA: Diagnosis not present

## 2021-02-20 DIAGNOSIS — Z0001 Encounter for general adult medical examination with abnormal findings: Secondary | ICD-10-CM | POA: Diagnosis not present

## 2021-02-20 DIAGNOSIS — E782 Mixed hyperlipidemia: Secondary | ICD-10-CM | POA: Diagnosis not present

## 2021-02-20 DIAGNOSIS — K219 Gastro-esophageal reflux disease without esophagitis: Secondary | ICD-10-CM | POA: Diagnosis not present

## 2021-02-20 DIAGNOSIS — E890 Postprocedural hypothyroidism: Secondary | ICD-10-CM | POA: Diagnosis not present

## 2021-02-20 DIAGNOSIS — R7303 Prediabetes: Secondary | ICD-10-CM | POA: Diagnosis not present

## 2021-02-20 DIAGNOSIS — I10 Essential (primary) hypertension: Secondary | ICD-10-CM | POA: Diagnosis not present

## 2021-02-20 DIAGNOSIS — Z0000 Encounter for general adult medical examination without abnormal findings: Secondary | ICD-10-CM | POA: Diagnosis not present

## 2021-03-31 DIAGNOSIS — L218 Other seborrheic dermatitis: Secondary | ICD-10-CM | POA: Diagnosis not present

## 2021-04-04 DIAGNOSIS — M170 Bilateral primary osteoarthritis of knee: Secondary | ICD-10-CM | POA: Diagnosis not present

## 2021-04-17 DIAGNOSIS — F333 Major depressive disorder, recurrent, severe with psychotic symptoms: Secondary | ICD-10-CM | POA: Diagnosis not present

## 2021-04-17 DIAGNOSIS — F411 Generalized anxiety disorder: Secondary | ICD-10-CM | POA: Diagnosis not present

## 2021-05-16 DIAGNOSIS — F411 Generalized anxiety disorder: Secondary | ICD-10-CM | POA: Diagnosis not present

## 2021-05-16 DIAGNOSIS — F333 Major depressive disorder, recurrent, severe with psychotic symptoms: Secondary | ICD-10-CM | POA: Diagnosis not present

## 2021-08-14 DIAGNOSIS — Z125 Encounter for screening for malignant neoplasm of prostate: Secondary | ICD-10-CM | POA: Diagnosis not present

## 2021-08-14 DIAGNOSIS — E890 Postprocedural hypothyroidism: Secondary | ICD-10-CM | POA: Diagnosis not present

## 2021-08-14 DIAGNOSIS — R7303 Prediabetes: Secondary | ICD-10-CM | POA: Diagnosis not present

## 2021-08-21 DIAGNOSIS — K219 Gastro-esophageal reflux disease without esophagitis: Secondary | ICD-10-CM | POA: Diagnosis not present

## 2021-08-21 DIAGNOSIS — E890 Postprocedural hypothyroidism: Secondary | ICD-10-CM | POA: Diagnosis not present

## 2021-08-21 DIAGNOSIS — E782 Mixed hyperlipidemia: Secondary | ICD-10-CM | POA: Diagnosis not present

## 2021-08-21 DIAGNOSIS — I10 Essential (primary) hypertension: Secondary | ICD-10-CM | POA: Diagnosis not present

## 2021-08-21 DIAGNOSIS — M545 Low back pain, unspecified: Secondary | ICD-10-CM | POA: Diagnosis not present

## 2021-08-21 DIAGNOSIS — Z125 Encounter for screening for malignant neoplasm of prostate: Secondary | ICD-10-CM | POA: Diagnosis not present

## 2021-08-21 DIAGNOSIS — G8929 Other chronic pain: Secondary | ICD-10-CM | POA: Diagnosis not present

## 2021-10-12 DIAGNOSIS — F333 Major depressive disorder, recurrent, severe with psychotic symptoms: Secondary | ICD-10-CM | POA: Diagnosis not present

## 2021-10-12 DIAGNOSIS — F411 Generalized anxiety disorder: Secondary | ICD-10-CM | POA: Diagnosis not present

## 2021-10-26 DIAGNOSIS — F411 Generalized anxiety disorder: Secondary | ICD-10-CM | POA: Diagnosis not present

## 2021-10-26 DIAGNOSIS — F333 Major depressive disorder, recurrent, severe with psychotic symptoms: Secondary | ICD-10-CM | POA: Diagnosis not present

## 2021-12-01 DIAGNOSIS — M5416 Radiculopathy, lumbar region: Secondary | ICD-10-CM | POA: Diagnosis not present

## 2021-12-07 ENCOUNTER — Other Ambulatory Visit: Payer: Self-pay | Admitting: Orthopedic Surgery

## 2021-12-07 DIAGNOSIS — M5416 Radiculopathy, lumbar region: Secondary | ICD-10-CM

## 2022-01-17 DIAGNOSIS — M48061 Spinal stenosis, lumbar region without neurogenic claudication: Secondary | ICD-10-CM | POA: Diagnosis not present

## 2022-01-17 DIAGNOSIS — M5416 Radiculopathy, lumbar region: Secondary | ICD-10-CM | POA: Diagnosis not present

## 2022-01-17 DIAGNOSIS — M5136 Other intervertebral disc degeneration, lumbar region: Secondary | ICD-10-CM | POA: Diagnosis not present

## 2022-01-25 DIAGNOSIS — L218 Other seborrheic dermatitis: Secondary | ICD-10-CM | POA: Diagnosis not present

## 2022-02-06 DIAGNOSIS — M5416 Radiculopathy, lumbar region: Secondary | ICD-10-CM | POA: Diagnosis not present

## 2022-02-06 DIAGNOSIS — M48062 Spinal stenosis, lumbar region with neurogenic claudication: Secondary | ICD-10-CM | POA: Diagnosis not present

## 2022-02-06 DIAGNOSIS — M5126 Other intervertebral disc displacement, lumbar region: Secondary | ICD-10-CM | POA: Diagnosis not present

## 2022-02-15 DIAGNOSIS — E782 Mixed hyperlipidemia: Secondary | ICD-10-CM | POA: Diagnosis not present

## 2022-02-15 DIAGNOSIS — R7303 Prediabetes: Secondary | ICD-10-CM | POA: Diagnosis not present

## 2022-02-15 DIAGNOSIS — Z125 Encounter for screening for malignant neoplasm of prostate: Secondary | ICD-10-CM | POA: Diagnosis not present

## 2022-02-27 ENCOUNTER — Other Ambulatory Visit: Payer: Self-pay | Admitting: Orthopedic Surgery

## 2022-02-27 DIAGNOSIS — M5136 Other intervertebral disc degeneration, lumbar region: Secondary | ICD-10-CM | POA: Diagnosis not present

## 2022-02-27 DIAGNOSIS — M5416 Radiculopathy, lumbar region: Secondary | ICD-10-CM | POA: Diagnosis not present

## 2022-02-27 DIAGNOSIS — M4807 Spinal stenosis, lumbosacral region: Secondary | ICD-10-CM | POA: Diagnosis not present

## 2022-03-07 DIAGNOSIS — E559 Vitamin D deficiency, unspecified: Secondary | ICD-10-CM | POA: Diagnosis not present

## 2022-03-07 DIAGNOSIS — I10 Essential (primary) hypertension: Secondary | ICD-10-CM | POA: Diagnosis not present

## 2022-03-07 DIAGNOSIS — R7303 Prediabetes: Secondary | ICD-10-CM | POA: Diagnosis not present

## 2022-03-07 DIAGNOSIS — E890 Postprocedural hypothyroidism: Secondary | ICD-10-CM | POA: Diagnosis not present

## 2022-03-07 DIAGNOSIS — Z0001 Encounter for general adult medical examination with abnormal findings: Secondary | ICD-10-CM | POA: Diagnosis not present

## 2022-03-07 DIAGNOSIS — Z1331 Encounter for screening for depression: Secondary | ICD-10-CM | POA: Diagnosis not present

## 2022-03-07 DIAGNOSIS — Z0000 Encounter for general adult medical examination without abnormal findings: Secondary | ICD-10-CM | POA: Diagnosis not present

## 2022-03-07 DIAGNOSIS — E782 Mixed hyperlipidemia: Secondary | ICD-10-CM | POA: Diagnosis not present

## 2022-03-07 DIAGNOSIS — K219 Gastro-esophageal reflux disease without esophagitis: Secondary | ICD-10-CM | POA: Diagnosis not present

## 2022-03-14 ENCOUNTER — Ambulatory Visit
Admission: RE | Admit: 2022-03-14 | Discharge: 2022-03-14 | Disposition: A | Discharge status: Home / Self Care | Payer: Medicare HMO | Source: Ambulatory Visit | Attending: Orthopedic Surgery | Admitting: Orthopedic Surgery

## 2022-03-14 DIAGNOSIS — M5416 Radiculopathy, lumbar region: Secondary | ICD-10-CM

## 2022-03-14 DIAGNOSIS — M545 Low back pain, unspecified: Secondary | ICD-10-CM | POA: Diagnosis not present

## 2022-03-14 DIAGNOSIS — M48061 Spinal stenosis, lumbar region without neurogenic claudication: Secondary | ICD-10-CM | POA: Diagnosis not present

## 2022-03-26 DIAGNOSIS — M5416 Radiculopathy, lumbar region: Secondary | ICD-10-CM | POA: Diagnosis not present

## 2022-03-26 DIAGNOSIS — M5136 Other intervertebral disc degeneration, lumbar region: Secondary | ICD-10-CM | POA: Diagnosis not present

## 2022-03-26 DIAGNOSIS — M5126 Other intervertebral disc displacement, lumbar region: Secondary | ICD-10-CM | POA: Diagnosis not present

## 2022-04-19 DIAGNOSIS — M5126 Other intervertebral disc displacement, lumbar region: Secondary | ICD-10-CM | POA: Diagnosis not present

## 2022-04-19 DIAGNOSIS — M5416 Radiculopathy, lumbar region: Secondary | ICD-10-CM | POA: Diagnosis not present

## 2022-05-10 DIAGNOSIS — M5416 Radiculopathy, lumbar region: Secondary | ICD-10-CM | POA: Diagnosis not present

## 2022-05-10 DIAGNOSIS — M5136 Other intervertebral disc degeneration, lumbar region: Secondary | ICD-10-CM | POA: Diagnosis not present

## 2022-05-10 DIAGNOSIS — M5126 Other intervertebral disc displacement, lumbar region: Secondary | ICD-10-CM | POA: Diagnosis not present

## 2022-08-01 DIAGNOSIS — M5416 Radiculopathy, lumbar region: Secondary | ICD-10-CM | POA: Diagnosis not present

## 2022-08-01 DIAGNOSIS — M5126 Other intervertebral disc displacement, lumbar region: Secondary | ICD-10-CM | POA: Diagnosis not present

## 2022-08-22 DIAGNOSIS — M5416 Radiculopathy, lumbar region: Secondary | ICD-10-CM | POA: Diagnosis not present

## 2022-08-22 DIAGNOSIS — M48061 Spinal stenosis, lumbar region without neurogenic claudication: Secondary | ICD-10-CM | POA: Diagnosis not present

## 2022-08-22 DIAGNOSIS — M4807 Spinal stenosis, lumbosacral region: Secondary | ICD-10-CM | POA: Diagnosis not present

## 2022-08-22 DIAGNOSIS — M5126 Other intervertebral disc displacement, lumbar region: Secondary | ICD-10-CM | POA: Diagnosis not present

## 2022-08-22 DIAGNOSIS — M5136 Other intervertebral disc degeneration, lumbar region: Secondary | ICD-10-CM | POA: Diagnosis not present

## 2022-08-22 DIAGNOSIS — E119 Type 2 diabetes mellitus without complications: Secondary | ICD-10-CM | POA: Diagnosis not present

## 2022-08-22 DIAGNOSIS — M48062 Spinal stenosis, lumbar region with neurogenic claudication: Secondary | ICD-10-CM | POA: Diagnosis not present

## 2022-09-10 DIAGNOSIS — E890 Postprocedural hypothyroidism: Secondary | ICD-10-CM | POA: Diagnosis not present

## 2022-09-10 DIAGNOSIS — R7303 Prediabetes: Secondary | ICD-10-CM | POA: Diagnosis not present

## 2022-09-11 DIAGNOSIS — E669 Obesity, unspecified: Secondary | ICD-10-CM | POA: Diagnosis not present

## 2022-09-11 DIAGNOSIS — Z125 Encounter for screening for malignant neoplasm of prostate: Secondary | ICD-10-CM | POA: Diagnosis not present

## 2022-09-11 DIAGNOSIS — Z6834 Body mass index (BMI) 34.0-34.9, adult: Secondary | ICD-10-CM | POA: Diagnosis not present

## 2022-09-11 DIAGNOSIS — I10 Essential (primary) hypertension: Secondary | ICD-10-CM | POA: Diagnosis not present

## 2022-09-11 DIAGNOSIS — E782 Mixed hyperlipidemia: Secondary | ICD-10-CM | POA: Diagnosis not present

## 2022-09-11 DIAGNOSIS — K219 Gastro-esophageal reflux disease without esophagitis: Secondary | ICD-10-CM | POA: Diagnosis not present

## 2022-10-31 DIAGNOSIS — F333 Major depressive disorder, recurrent, severe with psychotic symptoms: Secondary | ICD-10-CM | POA: Diagnosis not present

## 2022-10-31 DIAGNOSIS — F411 Generalized anxiety disorder: Secondary | ICD-10-CM | POA: Diagnosis not present

## 2022-11-07 DIAGNOSIS — F333 Major depressive disorder, recurrent, severe with psychotic symptoms: Secondary | ICD-10-CM | POA: Diagnosis not present

## 2022-11-07 DIAGNOSIS — F411 Generalized anxiety disorder: Secondary | ICD-10-CM | POA: Diagnosis not present

## 2022-11-16 DIAGNOSIS — F411 Generalized anxiety disorder: Secondary | ICD-10-CM | POA: Diagnosis not present

## 2022-11-16 DIAGNOSIS — F333 Major depressive disorder, recurrent, severe with psychotic symptoms: Secondary | ICD-10-CM | POA: Diagnosis not present

## 2023-01-24 DIAGNOSIS — H5213 Myopia, bilateral: Secondary | ICD-10-CM | POA: Diagnosis not present

## 2023-02-18 DIAGNOSIS — F333 Major depressive disorder, recurrent, severe with psychotic symptoms: Secondary | ICD-10-CM | POA: Diagnosis not present

## 2023-02-18 DIAGNOSIS — F411 Generalized anxiety disorder: Secondary | ICD-10-CM | POA: Diagnosis not present

## 2023-03-05 DIAGNOSIS — R7303 Prediabetes: Secondary | ICD-10-CM | POA: Diagnosis not present

## 2023-03-05 DIAGNOSIS — E782 Mixed hyperlipidemia: Secondary | ICD-10-CM | POA: Diagnosis not present

## 2023-03-05 DIAGNOSIS — Z125 Encounter for screening for malignant neoplasm of prostate: Secondary | ICD-10-CM | POA: Diagnosis not present

## 2023-03-07 DIAGNOSIS — H5202 Hypermetropia, left eye: Secondary | ICD-10-CM | POA: Diagnosis not present

## 2023-03-07 DIAGNOSIS — H52201 Unspecified astigmatism, right eye: Secondary | ICD-10-CM | POA: Diagnosis not present

## 2023-03-12 DIAGNOSIS — Z0000 Encounter for general adult medical examination without abnormal findings: Secondary | ICD-10-CM | POA: Diagnosis not present

## 2023-03-12 DIAGNOSIS — I10 Essential (primary) hypertension: Secondary | ICD-10-CM | POA: Diagnosis not present

## 2023-03-12 DIAGNOSIS — Z1331 Encounter for screening for depression: Secondary | ICD-10-CM | POA: Diagnosis not present

## 2023-03-12 DIAGNOSIS — E890 Postprocedural hypothyroidism: Secondary | ICD-10-CM | POA: Diagnosis not present

## 2023-03-12 DIAGNOSIS — Z0001 Encounter for general adult medical examination with abnormal findings: Secondary | ICD-10-CM | POA: Diagnosis not present

## 2023-03-12 DIAGNOSIS — E119 Type 2 diabetes mellitus without complications: Secondary | ICD-10-CM | POA: Diagnosis not present

## 2023-03-12 DIAGNOSIS — M2550 Pain in unspecified joint: Secondary | ICD-10-CM | POA: Diagnosis not present

## 2023-03-12 DIAGNOSIS — E782 Mixed hyperlipidemia: Secondary | ICD-10-CM | POA: Diagnosis not present

## 2023-03-19 DIAGNOSIS — M19012 Primary osteoarthritis, left shoulder: Secondary | ICD-10-CM | POA: Diagnosis not present

## 2023-03-19 DIAGNOSIS — M170 Bilateral primary osteoarthritis of knee: Secondary | ICD-10-CM | POA: Diagnosis not present

## 2023-03-19 DIAGNOSIS — M51371 Other intervertebral disc degeneration, lumbosacral region with lower extremity pain only: Secondary | ICD-10-CM | POA: Diagnosis not present

## 2023-03-19 DIAGNOSIS — M19011 Primary osteoarthritis, right shoulder: Secondary | ICD-10-CM | POA: Diagnosis not present

## 2023-04-10 DIAGNOSIS — M7542 Impingement syndrome of left shoulder: Secondary | ICD-10-CM | POA: Diagnosis not present

## 2023-04-10 DIAGNOSIS — M7551 Bursitis of right shoulder: Secondary | ICD-10-CM | POA: Diagnosis not present

## 2023-04-10 DIAGNOSIS — M778 Other enthesopathies, not elsewhere classified: Secondary | ICD-10-CM | POA: Diagnosis not present

## 2023-04-10 DIAGNOSIS — G8929 Other chronic pain: Secondary | ICD-10-CM | POA: Diagnosis not present

## 2023-04-10 DIAGNOSIS — M19011 Primary osteoarthritis, right shoulder: Secondary | ICD-10-CM | POA: Diagnosis not present

## 2023-04-10 DIAGNOSIS — M19012 Primary osteoarthritis, left shoulder: Secondary | ICD-10-CM | POA: Diagnosis not present

## 2023-04-10 DIAGNOSIS — M7552 Bursitis of left shoulder: Secondary | ICD-10-CM | POA: Diagnosis not present

## 2023-04-10 DIAGNOSIS — M7541 Impingement syndrome of right shoulder: Secondary | ICD-10-CM | POA: Diagnosis not present

## 2023-04-16 DIAGNOSIS — F333 Major depressive disorder, recurrent, severe with psychotic symptoms: Secondary | ICD-10-CM | POA: Diagnosis not present

## 2023-04-16 DIAGNOSIS — F411 Generalized anxiety disorder: Secondary | ICD-10-CM | POA: Diagnosis not present

## 2023-06-26 DIAGNOSIS — F419 Anxiety disorder, unspecified: Secondary | ICD-10-CM | POA: Diagnosis not present

## 2023-06-26 DIAGNOSIS — F4323 Adjustment disorder with mixed anxiety and depressed mood: Secondary | ICD-10-CM | POA: Diagnosis not present

## 2023-07-10 DIAGNOSIS — F4323 Adjustment disorder with mixed anxiety and depressed mood: Secondary | ICD-10-CM | POA: Diagnosis not present

## 2023-07-10 DIAGNOSIS — F419 Anxiety disorder, unspecified: Secondary | ICD-10-CM | POA: Diagnosis not present

## 2023-07-31 DIAGNOSIS — F4323 Adjustment disorder with mixed anxiety and depressed mood: Secondary | ICD-10-CM | POA: Diagnosis not present

## 2023-07-31 DIAGNOSIS — F419 Anxiety disorder, unspecified: Secondary | ICD-10-CM | POA: Diagnosis not present

## 2023-08-19 DIAGNOSIS — M170 Bilateral primary osteoarthritis of knee: Secondary | ICD-10-CM | POA: Diagnosis not present

## 2023-08-19 DIAGNOSIS — M19012 Primary osteoarthritis, left shoulder: Secondary | ICD-10-CM | POA: Diagnosis not present

## 2023-08-19 DIAGNOSIS — M19011 Primary osteoarthritis, right shoulder: Secondary | ICD-10-CM | POA: Diagnosis not present

## 2023-08-19 DIAGNOSIS — M51371 Other intervertebral disc degeneration, lumbosacral region with lower extremity pain only: Secondary | ICD-10-CM | POA: Diagnosis not present

## 2023-08-19 DIAGNOSIS — Z791 Long term (current) use of non-steroidal anti-inflammatories (NSAID): Secondary | ICD-10-CM | POA: Diagnosis not present

## 2023-08-21 DIAGNOSIS — F419 Anxiety disorder, unspecified: Secondary | ICD-10-CM | POA: Diagnosis not present

## 2023-08-21 DIAGNOSIS — F4323 Adjustment disorder with mixed anxiety and depressed mood: Secondary | ICD-10-CM | POA: Diagnosis not present

## 2023-09-05 DIAGNOSIS — E119 Type 2 diabetes mellitus without complications: Secondary | ICD-10-CM | POA: Diagnosis not present

## 2023-09-05 DIAGNOSIS — M19012 Primary osteoarthritis, left shoulder: Secondary | ICD-10-CM | POA: Diagnosis not present

## 2023-09-05 DIAGNOSIS — M19011 Primary osteoarthritis, right shoulder: Secondary | ICD-10-CM | POA: Diagnosis not present

## 2023-09-05 DIAGNOSIS — E890 Postprocedural hypothyroidism: Secondary | ICD-10-CM | POA: Diagnosis not present

## 2023-09-05 DIAGNOSIS — Z791 Long term (current) use of non-steroidal anti-inflammatories (NSAID): Secondary | ICD-10-CM | POA: Diagnosis not present

## 2023-09-12 DIAGNOSIS — E890 Postprocedural hypothyroidism: Secondary | ICD-10-CM | POA: Diagnosis not present

## 2023-09-12 DIAGNOSIS — E119 Type 2 diabetes mellitus without complications: Secondary | ICD-10-CM | POA: Diagnosis not present

## 2023-09-12 DIAGNOSIS — I10 Essential (primary) hypertension: Secondary | ICD-10-CM | POA: Diagnosis not present

## 2023-09-12 DIAGNOSIS — K219 Gastro-esophageal reflux disease without esophagitis: Secondary | ICD-10-CM | POA: Diagnosis not present

## 2023-09-12 DIAGNOSIS — Z125 Encounter for screening for malignant neoplasm of prostate: Secondary | ICD-10-CM | POA: Diagnosis not present

## 2023-09-12 DIAGNOSIS — E782 Mixed hyperlipidemia: Secondary | ICD-10-CM | POA: Diagnosis not present

## 2023-10-09 DIAGNOSIS — F4323 Adjustment disorder with mixed anxiety and depressed mood: Secondary | ICD-10-CM | POA: Diagnosis not present

## 2023-10-09 DIAGNOSIS — F419 Anxiety disorder, unspecified: Secondary | ICD-10-CM | POA: Diagnosis not present

## 2023-11-06 DIAGNOSIS — F419 Anxiety disorder, unspecified: Secondary | ICD-10-CM | POA: Diagnosis not present

## 2023-11-06 DIAGNOSIS — F4323 Adjustment disorder with mixed anxiety and depressed mood: Secondary | ICD-10-CM | POA: Diagnosis not present
